# Patient Record
Sex: Female | Born: 1995 | Race: White | Hispanic: No | Marital: Married | Smoking: Never smoker
Health system: Southern US, Community
[De-identification: ages and names within clinical notes are randomized; demographics above are authoritative.]

## PROBLEM LIST (undated history)

## (undated) DIAGNOSIS — K219 Gastro-esophageal reflux disease without esophagitis: Secondary | ICD-10-CM

## (undated) HISTORY — PX: NEPHRECTOMY: SHX65

## (undated) HISTORY — DX: Gastro-esophageal reflux disease without esophagitis: K21.9

## (undated) HISTORY — PX: OTHER SURGICAL HISTORY: SHX169

## (undated) HISTORY — PX: CHOLECYSTECTOMY: SHX55

---
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
## 2018-01-04 ENCOUNTER — Encounter (HOSPITAL_COMMUNITY): Payer: Self-pay | Admitting: Emergency Medicine

## 2018-01-04 ENCOUNTER — Emergency Department (HOSPITAL_COMMUNITY)
Admission: EM | Admit: 2018-01-04 | Discharge: 2018-01-04 | Disposition: A | Discharge status: Home / Self Care | Payer: Self-pay | Attending: Emergency Medicine | Admitting: Emergency Medicine

## 2018-01-04 ENCOUNTER — Emergency Department (HOSPITAL_COMMUNITY): Payer: Self-pay

## 2018-01-04 DIAGNOSIS — R112 Nausea with vomiting, unspecified: Secondary | ICD-10-CM | POA: Insufficient documentation

## 2018-01-04 DIAGNOSIS — N390 Urinary tract infection, site not specified: Secondary | ICD-10-CM | POA: Insufficient documentation

## 2018-01-04 DIAGNOSIS — R42 Dizziness and giddiness: Secondary | ICD-10-CM | POA: Insufficient documentation

## 2018-01-04 LAB — CBC
HCT: 42.2 % (ref 36.0–46.0)
Hemoglobin: 14 g/dL (ref 12.0–15.0)
MCH: 27.9 pg (ref 26.0–34.0)
MCHC: 33.2 g/dL (ref 30.0–36.0)
MCV: 84.2 fL (ref 78.0–100.0)
Platelets: 304 10*3/uL (ref 150–400)
RBC: 5.01 MIL/uL (ref 3.87–5.11)
RDW: 13.4 % (ref 11.5–15.5)
WBC: 12.8 10*3/uL — ABNORMAL HIGH (ref 4.0–10.5)

## 2018-01-04 LAB — COMPREHENSIVE METABOLIC PANEL
ALT: 19 U/L (ref 14–54)
AST: 23 U/L (ref 15–41)
Albumin: 4.1 g/dL (ref 3.5–5.0)
Alkaline Phosphatase: 100 U/L (ref 38–126)
Anion gap: 8 (ref 5–15)
BUN: 15 mg/dL (ref 6–20)
CO2: 25 mmol/L (ref 22–32)
Calcium: 9.2 mg/dL (ref 8.9–10.3)
Chloride: 104 mmol/L (ref 101–111)
Creatinine, Ser: 0.74 mg/dL (ref 0.44–1.00)
GFR calc Af Amer: >60 mL/min (ref >60)
GFR calc non Af Amer: >60 mL/min (ref >60)
Glucose, Bld: 115 mg/dL — ABNORMAL HIGH (ref 65–99)
Potassium: 4.4 mmol/L (ref 3.5–5.1)
Sodium: 137 mmol/L (ref 135–145)
Total Bilirubin: 0.2 mg/dL — ABNORMAL LOW (ref 0.3–1.2)
Total Protein: 8 g/dL (ref 6.5–8.1)

## 2018-01-04 LAB — URINALYSIS, ROUTINE W REFLEX MICROSCOPIC
Bilirubin Urine: NEGATIVE
Glucose, UA: NEGATIVE mg/dL
Ketones, ur: NEGATIVE mg/dL
Nitrite: POSITIVE — AB
Protein, ur: 30 mg/dL — AB
Specific Gravity, Urine: 1.016 (ref 1.005–1.030)
pH: 6 (ref 5.0–8.0)

## 2018-01-04 LAB — I-STAT BETA HCG BLOOD, ED (MC, WL, AP ONLY): I-stat hCG, quantitative: <5 m[IU]/mL (ref <5)

## 2018-01-04 LAB — LIPASE, BLOOD: Lipase: 29 U/L (ref 11–51)

## 2018-01-04 MED ORDER — ONDANSETRON 4 MG PO TBDP
4.0000 mg | ORAL_TABLET | Freq: Once | ORAL | Status: AC | PRN
  Administered 2018-01-04: 4 mg via ORAL
  Filled 2018-01-04: qty 1

## 2018-01-04 MED ORDER — SODIUM CHLORIDE 0.9 % IV BOLUS (SEPSIS)
1000.0000 mL | Freq: Once | INTRAVENOUS | Status: AC
  Administered 2018-01-04: 1000 mL via INTRAVENOUS

## 2018-01-04 MED ORDER — PROMETHAZINE HCL 25 MG PO TABS: 25.0000 mg | ORAL_TABLET | Freq: Three times a day (TID) | ORAL | 0 refills | Status: DC | PRN

## 2018-01-04 MED ORDER — MECLIZINE HCL 25 MG PO TABS: 25.0000 mg | ORAL_TABLET | Freq: Three times a day (TID) | ORAL | 0 refills | Status: DC | PRN

## 2018-01-04 MED ORDER — PROMETHAZINE HCL 25 MG/ML IJ SOLN
25.0000 mg | Freq: Once | INTRAMUSCULAR | Status: AC
  Administered 2018-01-04: 25 mg via INTRAVENOUS
  Filled 2018-01-04: qty 1

## 2018-01-04 MED ORDER — GADOBENATE DIMEGLUMINE 529 MG/ML IV SOLN
17.0000 mL | Freq: Once | INTRAVENOUS | Status: AC | PRN
  Administered 2018-01-04: 20 mL via INTRAVENOUS

## 2018-01-04 MED ORDER — CEFTRIAXONE SODIUM 1 G IJ SOLR
1.0000 g | Freq: Once | INTRAMUSCULAR | Status: AC
  Administered 2018-01-04: 1 g via INTRAVENOUS
  Filled 2018-01-04: qty 10

## 2018-01-04 MED ORDER — SODIUM CHLORIDE 0.9 % IV BOLUS (SEPSIS)
500.0000 mL | Freq: Once | INTRAVENOUS | Status: AC
  Administered 2018-01-04: 500 mL via INTRAVENOUS

## 2018-01-04 MED ORDER — LORAZEPAM 2 MG/ML IJ SOLN
1.0000 mg | Freq: Once | INTRAMUSCULAR | Status: AC
  Administered 2018-01-04: 1 mg via INTRAVENOUS
  Filled 2018-01-04: qty 1

## 2018-01-04 MED ORDER — MECLIZINE HCL 25 MG PO TABS
50.0000 mg | ORAL_TABLET | Freq: Once | ORAL | Status: AC
  Administered 2018-01-04: 50 mg via ORAL
  Filled 2018-01-04: qty 2

## 2018-01-04 MED ORDER — CEPHALEXIN 500 MG PO CAPS: 500.0000 mg | ORAL_CAPSULE | Freq: Three times a day (TID) | ORAL | 0 refills | Status: DC

## 2018-01-04 MED ORDER — ONDANSETRON HCL 4 MG/2ML IJ SOLN
4.0000 mg | Freq: Once | INTRAMUSCULAR | Status: AC
  Administered 2018-01-04: 4 mg via INTRAVENOUS
  Filled 2018-01-04: qty 2

## 2018-01-04 NOTE — ED Notes (Signed)
Pt cannot use restroom at this time, aware urine specimen is needed.  

## 2018-01-04 NOTE — ED Provider Notes (Signed)
[facility]-EMERGENCY DEPT Provider Note   CSN: [id number] Arrival date & time: 01/04/18  4098     History   Chief Complaint Chief Complaint  Patient presents with  . Emesis    HPI Jacqueline Peck is a 22 y.o. female.  The history is provided by the patient. No language interpreter was used.  Emesis      Jacqueline Peck is a 22 y.o. female who presents to the Emergency Department complaining of vomiting, dizziness. She was in her routine state of health until 545 this morning when she awoke and had sudden onset dizziness described as a room spinning sensation as well as nausea and vomiting. The symptoms are constant but do worsen and improve at times. She is having difficulty walking and needs assistance to walk due to ataxia. She has associated mild headache. No vision changes, numbness. She has a generalized weakness. No recent injuries or illnesses. No prior similar symptoms. She does not smoke. She drinks occasional alcohol. No street drugs. No family history of significant medical issues.  History reviewed. No pertinent past medical history.  There are no active problems to display for this patient.   Past Surgical History:  Procedure Laterality Date  . kidney removed      OB History    No data available       Home Medications    Prior to Admission medications   Medication Sig Start Date End Date Taking? Authorizing Provider  ibuprofen (ADVIL,MOTRIN) 200 MG tablet Take 200 mg by mouth every 6 (six) hours as needed for headache.   Yes [provider]  cephALEXin (KEFLEX) 500 MG capsule Take 1 capsule (500 mg total) by mouth 3 (three) times daily. 01/04/18   Tilden Fossa, MD  meclizine (ANTIVERT) 25 MG tablet Take 1 tablet (25 mg total) by mouth 3 (three) times daily as needed for dizziness. 01/04/18   Tilden Fossa, MD  permethrin (ELIMITE) 5 % cream Use as directed on bottle and repeat in 1 week. Patient not taking: Reported on  01/04/2018 09/25/12   Linna Hoff, MD  promethazine (PHENERGAN) 25 MG tablet Take 1 tablet (25 mg total) by mouth every 8 (eight) hours as needed for nausea or vomiting. 01/04/18   Tilden Fossa, MD    Family History No family history on file.  Social History Social History   Tobacco Use  . Smoking status: Not on file  Substance Use Topics  . Alcohol use: Not on file  . Drug use: Not on file     Allergies   Patient has no known allergies.   Review of Systems Review of Systems  Gastrointestinal: Positive for vomiting.  All other systems reviewed and are negative.    Physical Exam Updated Vital Signs BP 131/89   Pulse 82   Temp 97.7 F (36.5 C) (Oral)   Resp 18   Ht 5\' 4"  (1.626 m)   Wt 83.9 kg (185 lb)   SpO2 100%   BMI 31.76 kg/m   Physical Exam  Constitutional: She is oriented to person, place, and time. She appears well-developed and well-nourished.  Uncomfortable appearing  HENT:  Head: Normocephalic and atraumatic.  Eyes: Pupils are equal, round, and reactive to light.  Pupils equal round and reactive. EOM I. There is horizontal nystagmus with fast feet to the left.  Cardiovascular: Normal rate and regular rhythm.  No murmur heard. Pulmonary/Chest: Effort normal and breath sounds normal. No respiratory distress.  Abdominal: Soft. There is  no tenderness. There is no rebound and no guarding.  Musculoskeletal: She exhibits no edema or tenderness.  Neurological: She is alert and oriented to person, place, and time.  No facial asymmetry. Five out of five strength in all four extremities with sensation to light touch intact in all four extremities. No pronator drift. Ataxic gait.   Skin: Skin is warm and dry.  Psychiatric: She has a normal mood and affect. Her behavior is normal.  Nursing note and vitals reviewed.    ED Treatments / Results  Labs (all labs ordered are listed, but only abnormal results are displayed) Labs Reviewed  COMPREHENSIVE  METABOLIC PANEL - Abnormal; Notable for the following components:      Result Value   Glucose, Bld 115 (*)    Total Bilirubin 0.2 (*)    All other components within normal limits  CBC - Abnormal; Notable for the following components:   WBC 12.8 (*)    All other components within normal limits  URINALYSIS, ROUTINE W REFLEX MICROSCOPIC - Abnormal; Notable for the following components:   APPearance HAZY (*)    Hgb urine dipstick SMALL (*)    Protein, ur 30 (*)    Nitrite POSITIVE (*)    Leukocytes, UA SMALL (*)    Bacteria, UA MANY (*)    Squamous Epithelial / LPF 0-5 (*)    All other components within normal limits  LIPASE, BLOOD  I-STAT BETA HCG BLOOD, ED ([facility], [facility], [facility] ONLY)    EKG  EKG Interpretation None       Radiology Mr Laqueta JeanBrain W Wo Contrast  Result Date: 01/04/2018 CLINICAL DATA:  22 y/o  F; dizziness today. EXAM: MRI HEAD WITHOUT AND WITH CONTRAST TECHNIQUE: Multiplanar, multiecho pulse sequences of the brain and surrounding structures were obtained without and with intravenous contrast. CONTRAST:  17 cc MULTIHANCE GADOBENATE DIMEGLUMINE 529 MG/ML IV SOLN COMPARISON:  None. FINDINGS: Brain: No acute infarction, hemorrhage, hydrocephalus, extra-axial collection or mass lesion. No abnormal enhancement after administration of intravenous contrast. Vascular: Normal flow voids. Skull and upper cervical spine: Normal marrow signal. Sinuses/Orbits: Opacification of right frontal sinus, right anterior ethmoid air cells, and right maxillary sinus. Mild left maxillary sinus mucosal thickening. No significant abnormal signal of mastoid air cells. Orbits are unremarkable. Other: None. IMPRESSION: 1. Normal MRI of the brain. 2. Right frontal, anterior ethmoid, and maxillary sinus opacification is a right middle meatus obstructive pattern, direct visualization recommended. Electronically Signed   By: Mitzi HansenLance  Furusawa-Stratton M.D.   On: 01/04/2018 20:53    Procedures Procedures (including  critical care time)  Medications Ordered in ED Medications  ondansetron (ZOFRAN-ODT) disintegrating tablet 4 mg (4 mg Oral Given 01/04/18 1018)  sodium chloride 0.9 % bolus 1,000 mL (0 mLs Intravenous Stopped 01/04/18 1935)  ondansetron (ZOFRAN) injection 4 mg (4 mg Intravenous Given 01/04/18 1829)  LORazepam (ATIVAN) injection 1 mg (1 mg Intravenous Given 01/04/18 1828)  gadobenate dimeglumine (MULTIHANCE) injection 17 mL (20 mLs Intravenous Contrast Given 01/04/18 2023)  sodium chloride 0.9 % bolus 500 mL (0 mLs Intravenous Stopped 01/04/18 2139)  cefTRIAXone (ROCEPHIN) 1 g in sodium chloride 0.9 % 100 mL IVPB (0 g Intravenous Stopped 01/04/18 2130)  promethazine (PHENERGAN) injection 25 mg (25 mg Intravenous Given 01/04/18 2135)  meclizine (ANTIVERT) tablet 50 mg (50 mg Oral Given 01/04/18 2135)     Initial Impression / Assessment and Plan / ED Course  I have reviewed the triage vital signs and the nursing notes.  Pertinent labs & imaging  results that were available during my care of the patient were reviewed by me and considered in my medical decision making (see chart for details).     Patient here for evaluation of dizziness and vomiting. She does have nice documents on examination at no very toxic gait secondary to vertigo. Otherwise she is neurologically intact. UA is consistent with UTI, will treat with antibiotics given her symptoms. MRI obtained given severe symptoms with no significant acute abnormalities. Presentation and examination is not consistent with sinusitis or otitis media. Following treatment with antiemetics and meclizine in the department she is feeling improved and able to ambulate on her own. Plan to discharge home with treatment for UTI as well as vertigo. Discussed home care, outpatient follow-up and return precautions.  Final Clinical Impressions(s) / ED Diagnoses   Final diagnoses:  Vertigo  Non-intractable vomiting with nausea, unspecified vomiting type  Acute UTI     ED Discharge Orders        Ordered    meclizine (ANTIVERT) 25 MG tablet  3 times daily PRN     01/04/18 2247    cephALEXin (KEFLEX) 500 MG capsule  3 times daily     01/04/18 2247    promethazine (PHENERGAN) 25 MG tablet  Every 8 hours PRN     01/04/18 2247       Tilden Fossa, MD 01/04/18 2350

## 2018-01-04 NOTE — ED Triage Notes (Signed)
Pt c/o dizziness and n/v since waking up this am around 630a. Denies diarrhea or urinary problems.

## 2018-10-23 NOTE — L&D Delivery Note (Signed)
Pt had SROM early this am. She progressed very slowly to C/C/+1 station. She had an amnioinfusion for 4 hours prior to delivery. She pushed for 2 hours. She developed exhaustion and was offered the VE.This was placed at +3 station. She delivered with one contraction, no pop offs. NICU team in room for mec. Art cord gas 7.19. Placenta S/I. EBL-400cc. Second degree midline tear closed with 3-0 chromic. Baby to NBN.

## 2019-03-04 LAB — OB RESULTS CONSOLE RPR: RPR: NONREACTIVE

## 2019-03-04 LAB — OB RESULTS CONSOLE GC/CHLAMYDIA
Chlamydia: NEGATIVE
Gonorrhea: NEGATIVE

## 2019-03-04 LAB — OB RESULTS CONSOLE ANTIBODY SCREEN: Antibody Screen: NEGATIVE

## 2019-03-04 LAB — OB RESULTS CONSOLE ABO/RH: RH Type: POSITIVE

## 2019-03-04 LAB — OB RESULTS CONSOLE RUBELLA ANTIBODY, IGM: Rubella: NON-IMMUNE/NOT IMMUNE

## 2019-03-04 LAB — OB RESULTS CONSOLE HEPATITIS B SURFACE ANTIGEN: Hepatitis B Surface Ag: NEGATIVE

## 2019-03-04 LAB — OB RESULTS CONSOLE HIV ANTIBODY (ROUTINE TESTING): HIV: NONREACTIVE

## 2019-04-22 DIAGNOSIS — Z363 Encounter for antenatal screening for malformations: Secondary | ICD-10-CM | POA: Diagnosis not present

## 2019-06-10 DIAGNOSIS — Z3480 Encounter for supervision of other normal pregnancy, unspecified trimester: Secondary | ICD-10-CM | POA: Diagnosis not present

## 2019-07-03 DIAGNOSIS — Z23 Encounter for immunization: Secondary | ICD-10-CM | POA: Diagnosis not present

## 2019-07-16 ENCOUNTER — Other Ambulatory Visit: Payer: Self-pay

## 2019-07-16 ENCOUNTER — Inpatient Hospital Stay (HOSPITAL_COMMUNITY)
Admission: AD | Admit: 2019-07-16 | Discharge: 2019-07-16 | Disposition: A | Discharge status: Home / Self Care | Payer: Medicaid Other | Attending: Obstetrics | Admitting: Obstetrics

## 2019-07-16 ENCOUNTER — Encounter (HOSPITAL_COMMUNITY): Payer: Self-pay

## 2019-07-16 DIAGNOSIS — N859 Noninflammatory disorder of uterus, unspecified: Secondary | ICD-10-CM | POA: Insufficient documentation

## 2019-07-16 DIAGNOSIS — Z905 Acquired absence of kidney: Secondary | ICD-10-CM | POA: Diagnosis not present

## 2019-07-16 DIAGNOSIS — O9989 Other specified diseases and conditions complicating pregnancy, childbirth and the puerperium: Secondary | ICD-10-CM | POA: Insufficient documentation

## 2019-07-16 DIAGNOSIS — O26893 Other specified pregnancy related conditions, third trimester: Secondary | ICD-10-CM

## 2019-07-16 DIAGNOSIS — Z3A31 31 weeks gestation of pregnancy: Secondary | ICD-10-CM | POA: Insufficient documentation

## 2019-07-16 DIAGNOSIS — N858 Other specified noninflammatory disorders of uterus: Secondary | ICD-10-CM

## 2019-07-16 DIAGNOSIS — R1012 Left upper quadrant pain: Secondary | ICD-10-CM

## 2019-07-16 LAB — URINALYSIS, ROUTINE W REFLEX MICROSCOPIC
Bilirubin Urine: NEGATIVE
Glucose, UA: NEGATIVE mg/dL
Hgb urine dipstick: NEGATIVE
Ketones, ur: NEGATIVE mg/dL
Leukocytes,Ua: NEGATIVE
Nitrite: NEGATIVE
Protein, ur: NEGATIVE mg/dL
Specific Gravity, Urine: 1.011 (ref 1.005–1.030)
pH: 6 (ref 5.0–8.0)

## 2019-07-16 LAB — FETAL FIBRONECTIN: Fetal Fibronectin: NEGATIVE

## 2019-07-16 MED ORDER — NIFEDIPINE 10 MG PO CAPS
10.0000 mg | ORAL_CAPSULE | ORAL | Status: DC | PRN
  Administered 2019-07-16 (×2): 10 mg via ORAL
  Filled 2019-07-16 (×2): qty 1

## 2019-07-16 NOTE — MAU Note (Deleted)
Pt complaining of abdominal pain 1100 around every 20-30 minutes at 7/10. No bleeding or leaking. +FM.

## 2019-07-16 NOTE — MAU Note (Addendum)
Reports having a sharp upper left sided abdominal pain that comes and goes-first started 3 hours ago and occurs approximately every 20-30 mins.  No LOF/VB.  States she hasn't ever felt this pain before.  Endorses + FM.  Did not take anything for the pain.  Also feeling some lower abdominal cramps that feel like occasional period cramps.

## 2019-07-16 NOTE — Discharge Instructions (Signed)
Preterm Labor and Birth Information °Pregnancy normally lasts 39-41 weeks. Preterm labor is when labor starts early. It starts before you have been pregnant for 37 whole weeks. °What are the risk factors for preterm labor? °Preterm labor is more likely to occur in women who: °· Have an infection while pregnant. °· Have a cervix that is short. °· Have gone into preterm labor before. °· Have had surgery on their cervix. °· Are younger than age 24. °· Are older than age 35. °· Are African American. °· Are pregnant with two or more babies. °· Take street drugs while pregnant. °· Smoke while pregnant. °· Do not gain enough weight while pregnant. °· Got pregnant right after another pregnancy. °What are the symptoms of preterm labor? °Symptoms of preterm labor include: °· Cramps. The cramps may feel like the cramps some women get during their period. The cramps may happen with watery poop (diarrhea). °· Pain in the belly (abdomen). °· Pain in the lower back. °· Regular contractions or tightening. It may feel like your belly is getting tighter. °· Pressure in the lower belly that seems to get stronger. °· More fluid (discharge) leaking from the vagina. The fluid may be watery or bloody. °· Water breaking. °Why is it important to notice signs of preterm labor? °Babies who are born early may not be fully developed. They have a higher chance for: °· Long-term heart problems. °· Long-term lung problems. °· Trouble controlling body systems, like breathing. °· Bleeding in the brain. °· A condition called cerebral palsy. °· Learning difficulties. °· Death. °These risks are highest for babies who are born before 34 weeks of pregnancy. °How is preterm labor treated? °Treatment depends on: °· How long you were pregnant. °· Your condition. °· The health of your baby. °Treatment may involve: °· Having a stitch (suture) placed in your cervix. When you give birth, your cervix opens so the baby can come out. The stitch keeps the cervix  from opening too soon. °· Staying at the hospital. °· Taking or getting medicines, such as: °? Hormone medicines. °? Medicines to stop contractions. °? Medicines to help the baby’s lungs develop. °? Medicines to prevent your baby from having cerebral palsy. °What should I do if I am in preterm labor? °If you think you are going into labor too soon, call your doctor right away. °How can I prevent preterm labor? °· Do not use any tobacco products. °? Examples of these are cigarettes, chewing tobacco, and e-cigarettes. °? If you need help quitting, ask your doctor. °· Do not use street drugs. °· Do not use any medicines unless you ask your doctor if they are safe for you. °· Talk with your doctor before taking any herbal supplements. °· Make sure you gain enough weight. °· Watch for infection. If you think you might have an infection, get it checked right away. °· If you have gone into preterm labor before, tell your doctor. °This information is not intended to replace advice given to you by your health care provider. Make sure you discuss any questions you have with your health care provider. °Document Released: 01/05/2009 Document Revised: 01/31/2019 Document Reviewed: 03/01/2016 °Elsevier Patient Education © 2020 [employer] ° °

## 2019-07-16 NOTE — MAU Provider Note (Signed)
Chief Complaint:  Abdominal Pain   First Provider Initiated Contact with Patient 07/16/19 0305      HPI: Jacqueline Peck is a 23 y.o. G1P0 at 85w3dwho presents to maternity admissions reporting colicky sharp pain in her Left upper abdomen.  Comes and goes.  Pretty sharp and severe when it came.  Also has some lower pelvic cramping.  . She reports good fetal movement, denies LOF, vaginal bleeding, vaginal itching/burning, urinary symptoms, h/a, dizziness, n/v, diarrhea, constipation or fever/chills.    RN Note: Reports having a sharp upper left sided abdominal pain that comes and goes-first started 3 hours ago and occurs approximately every 20-30 mins.  No LOF/VB.  States she hasn't ever felt this pain before.  Endorses + FM.  Did not take anything for the pain.  Also feeling some lower abdominal cramps that feel like occasional period cramps.  Past Medical History: History reviewed. No pertinent past medical history.  Past obstetric history: OB History  Gravida Para Term Preterm AB Living  1            SAB TAB Ectopic Multiple Live Births               # Outcome Date GA Lbr Len/2nd Weight Sex Delivery Anes PTL Lv  1 Current             Past Surgical History: Past Surgical History:  Procedure Laterality Date  . NEPHRECTOMY Right     Family History: No family history on file.  Social History: Social History   Tobacco Use  . Smoking status: Never Smoker  . Smokeless tobacco: Never Used  Substance Use Topics  . Alcohol use: Not Currently  . Drug use: Never    Allergies: No Known Allergies  Meds:  No medications prior to admission.    I have reviewed patient's Past Medical Hx, Surgical Hx, Family Hx, Social Hx, medications and allergies.   ROS:  Review of Systems  Constitutional: Negative for chills and fever.  Respiratory: Negative for shortness of breath.   Gastrointestinal: Positive for abdominal pain. Negative for constipation, diarrhea and nausea.  Genitourinary:  Positive for pelvic pain. Negative for vaginal bleeding and vaginal discharge.  Musculoskeletal: Positive for back pain.   Other systems negative  Physical Exam   Patient Vitals for the past 24 hrs:  BP Temp Temp src Pulse Resp Weight  07/16/19 0248 133/78 98.5 F (36.9 C) Oral 86 19 -  07/16/19 0243 - - - - - 92.7 kg   Constitutional: Well-developed, well-nourished female in no acute distress.  Cardiovascular: normal rate and rhythm Respiratory: normal effort, clear to auscultation bilaterally GI: Abd soft, non-tender, gravid appropriate for gestational age.   No rebound or guarding. MS: Extremities nontender, no edema, normal ROM Neurologic: Alert and oriented x 4.  GU: Neg CVAT.  PELVIC EXAM:  Dilation: Closed Effacement (%): Thick Station: Ballotable Exam by:: Lelan Pons, CNM   FHT:  Baseline 140 , moderate variability, accelerations present, no decelerations Contractions: Uterine irritability   Labs: Results for orders placed or performed during the hospital encounter of 07/16/19 (from the past 24 hour(s))  Urinalysis, Routine w reflex microscopic     Status: None   Collection Time: 07/16/19  2:50 AM  Result Value Ref Range   Color, Urine YELLOW YELLOW   APPearance CLEAR CLEAR   Specific Gravity, Urine 1.011 1.005 - 1.030   pH 6.0 5.0 - 8.0   Glucose, UA NEGATIVE NEGATIVE mg/dL   Hgb urine dipstick NEGATIVE  NEGATIVE   Bilirubin Urine NEGATIVE NEGATIVE   Ketones, ur NEGATIVE NEGATIVE mg/dL   Protein, ur NEGATIVE NEGATIVE mg/dL   Nitrite NEGATIVE NEGATIVE   Leukocytes,Ua NEGATIVE NEGATIVE  Fetal fibronectin     Status: None   Collection Time: 07/16/19  3:16 AM  Result Value Ref Range   Fetal Fibronectin NEGATIVE NEGATIVE      Imaging:  No results found.  MAU Course/MDM: I have ordered labs and reviewed results. Urine is clear. Fetal fibronectin is negative NST reviewed, reassuring Uterine irritability noted with a few contractions Procardia given x 2 doses  with good relief of irritabilty while waiting for FFn to result  Assessment: Single intrauterine pregnancy at [redacted]w[redacted]d Uterine irritability LUQ pain, colicky, likely related to bowel gas  Plan: Discharge home Preterm Labor precautions and fetal kick counts Follow up in Office for prenatal visits and recheck of status  Encouraged to return here or to other Urgent Care/ED if she develops worsening of symptoms, increase in pain, fever, or other concerning symptoms.   Pt stable at time of discharge.  Wynelle Bourgeois CNM, MSN Certified Nurse-Midwife 07/16/2019 3:05 AM

## 2019-08-21 DIAGNOSIS — Z3480 Encounter for supervision of other normal pregnancy, unspecified trimester: Secondary | ICD-10-CM | POA: Diagnosis not present

## 2019-08-21 LAB — OB RESULTS CONSOLE GBS: GBS: POSITIVE

## 2019-09-11 DIAGNOSIS — O26843 Uterine size-date discrepancy, third trimester: Secondary | ICD-10-CM | POA: Diagnosis not present

## 2019-09-11 DIAGNOSIS — O403XX0 Polyhydramnios, third trimester, not applicable or unspecified: Secondary | ICD-10-CM | POA: Diagnosis not present

## 2019-09-12 ENCOUNTER — Telehealth (HOSPITAL_COMMUNITY): Payer: Self-pay | Admitting: *Deleted

## 2019-09-12 ENCOUNTER — Other Ambulatory Visit: Payer: Self-pay | Admitting: Obstetrics and Gynecology

## 2019-09-12 NOTE — Telephone Encounter (Signed)
Preadmission screen  

## 2019-09-13 ENCOUNTER — Other Ambulatory Visit (HOSPITAL_COMMUNITY)
Admission: RE | Admit: 2019-09-13 | Discharge: 2019-09-13 | Disposition: A | Discharge status: Home / Self Care | Payer: Medicaid Other | Source: Ambulatory Visit | Attending: Obstetrics and Gynecology | Admitting: Obstetrics and Gynecology

## 2019-09-13 ENCOUNTER — Other Ambulatory Visit (HOSPITAL_COMMUNITY): Payer: Medicaid Other

## 2019-09-13 DIAGNOSIS — Z01812 Encounter for preprocedural laboratory examination: Secondary | ICD-10-CM | POA: Insufficient documentation

## 2019-09-13 DIAGNOSIS — Z20828 Contact with and (suspected) exposure to other viral communicable diseases: Secondary | ICD-10-CM | POA: Insufficient documentation

## 2019-09-13 LAB — SARS CORONAVIRUS 2 (TAT 6-24 HRS): SARS Coronavirus 2: NEGATIVE

## 2019-09-15 ENCOUNTER — Inpatient Hospital Stay (HOSPITAL_COMMUNITY): Payer: Medicaid Other

## 2019-09-15 ENCOUNTER — Encounter (HOSPITAL_COMMUNITY): Payer: Self-pay | Admitting: General Practice

## 2019-09-15 ENCOUNTER — Other Ambulatory Visit: Payer: Self-pay

## 2019-09-15 ENCOUNTER — Inpatient Hospital Stay (HOSPITAL_COMMUNITY)
Admission: AD | Admit: 2019-09-15 | Discharge: 2019-09-18 | DRG: 807 | Disposition: A | Discharge status: Home / Self Care | Payer: Medicaid Other | Attending: Obstetrics and Gynecology | Admitting: Obstetrics and Gynecology

## 2019-09-15 DIAGNOSIS — O1414 Severe pre-eclampsia complicating childbirth: Secondary | ICD-10-CM | POA: Diagnosis present

## 2019-09-15 DIAGNOSIS — Z23 Encounter for immunization: Secondary | ICD-10-CM | POA: Diagnosis not present

## 2019-09-15 DIAGNOSIS — Z3490 Encounter for supervision of normal pregnancy, unspecified, unspecified trimester: Secondary | ICD-10-CM

## 2019-09-15 DIAGNOSIS — Z3A40 40 weeks gestation of pregnancy: Secondary | ICD-10-CM | POA: Diagnosis not present

## 2019-09-15 DIAGNOSIS — O164 Unspecified maternal hypertension, complicating childbirth: Secondary | ICD-10-CM | POA: Diagnosis not present

## 2019-09-15 DIAGNOSIS — O99824 Streptococcus B carrier state complicating childbirth: Secondary | ICD-10-CM | POA: Diagnosis not present

## 2019-09-15 DIAGNOSIS — O409XX0 Polyhydramnios, unspecified trimester, not applicable or unspecified: Secondary | ICD-10-CM | POA: Diagnosis present

## 2019-09-15 DIAGNOSIS — O403XX0 Polyhydramnios, third trimester, not applicable or unspecified: Principal | ICD-10-CM | POA: Diagnosis present

## 2019-09-15 DIAGNOSIS — O401XX0 Polyhydramnios, first trimester, not applicable or unspecified: Secondary | ICD-10-CM

## 2019-09-15 LAB — COMPREHENSIVE METABOLIC PANEL
ALT: 11 U/L (ref 0–44)
AST: 16 U/L (ref 15–41)
Albumin: 2.3 g/dL — ABNORMAL LOW (ref 3.5–5.0)
Alkaline Phosphatase: 177 U/L — ABNORMAL HIGH (ref 38–126)
Anion gap: 11 (ref 5–15)
BUN: 8 mg/dL (ref 6–20)
CO2: 21 mmol/L — ABNORMAL LOW (ref 22–32)
Calcium: 9 mg/dL (ref 8.9–10.3)
Chloride: 104 mmol/L (ref 98–111)
Creatinine, Ser: 0.83 mg/dL (ref 0.44–1.00)
GFR calc Af Amer: >60 mL/min (ref >60)
GFR calc non Af Amer: >60 mL/min (ref >60)
Glucose, Bld: 85 mg/dL (ref 70–99)
Potassium: 4.1 mmol/L (ref 3.5–5.1)
Sodium: 136 mmol/L (ref 135–145)
Total Bilirubin: 1.1 mg/dL (ref 0.3–1.2)
Total Protein: 6.1 g/dL — ABNORMAL LOW (ref 6.5–8.1)

## 2019-09-15 LAB — CBC
HCT: 33.1 % — ABNORMAL LOW (ref 36.0–46.0)
HCT: 31 % — ABNORMAL LOW (ref 36.0–46.0)
Hemoglobin: 9.9 g/dL — ABNORMAL LOW (ref 12.0–15.0)
Hemoglobin: 9.9 g/dL — ABNORMAL LOW (ref 12.0–15.0)
MCH: 25.6 pg — ABNORMAL LOW (ref 26.0–34.0)
MCH: 25.8 pg — ABNORMAL LOW (ref 26.0–34.0)
MCHC: 29.9 g/dL — ABNORMAL LOW (ref 30.0–36.0)
MCHC: 31.9 g/dL (ref 30.0–36.0)
MCV: 85.8 fL (ref 80.0–100.0)
MCV: 80.9 fL (ref 80.0–100.0)
Platelets: 258 10*3/uL (ref 150–400)
Platelets: 250 10*3/uL (ref 150–400)
RBC: 3.86 MIL/uL — ABNORMAL LOW (ref 3.87–5.11)
RBC: 3.83 MIL/uL — ABNORMAL LOW (ref 3.87–5.11)
RDW: 14.7 % (ref 11.5–15.5)
RDW: 14.8 % (ref 11.5–15.5)
WBC: 10.6 10*3/uL — ABNORMAL HIGH (ref 4.0–10.5)
WBC: 13 10*3/uL — ABNORMAL HIGH (ref 4.0–10.5)
nRBC: 0 % (ref 0.0–0.2)
nRBC: 0 % (ref 0.0–0.2)

## 2019-09-15 LAB — ABO/RH: ABO/RH(D): O POS

## 2019-09-15 LAB — PROTEIN / CREATININE RATIO, URINE
Creatinine, Urine: 60.59 mg/dL
Protein Creatinine Ratio: 0.84 mg/mg{Cre} — ABNORMAL HIGH (ref 0.00–0.15)
Total Protein, Urine: 51 mg/dL

## 2019-09-15 LAB — TYPE AND SCREEN
ABO/RH(D): O POS
Antibody Screen: NEGATIVE

## 2019-09-15 LAB — RPR: RPR Ser Ql: NONREACTIVE

## 2019-09-15 MED ORDER — ONDANSETRON HCL 4 MG/2ML IJ SOLN
4.0000 mg | Freq: Four times a day (QID) | INTRAMUSCULAR | Status: DC | PRN
  Administered 2019-09-16: 4 mg via INTRAVENOUS
  Filled 2019-09-15: qty 2

## 2019-09-15 MED ORDER — DIPHENHYDRAMINE HCL 50 MG/ML IJ SOLN: 12.5000 mg | INTRAMUSCULAR | Status: DC | PRN

## 2019-09-15 MED ORDER — LABETALOL HCL 5 MG/ML IV SOLN: 80.0000 mg | INTRAVENOUS | Status: DC | PRN

## 2019-09-15 MED ORDER — LACTATED RINGERS IV SOLN
500.0000 mL | Freq: Once | INTRAVENOUS | Status: AC
  Administered 2019-09-16: 500 mL via INTRAVENOUS

## 2019-09-15 MED ORDER — HYDRALAZINE HCL 20 MG/ML IJ SOLN: 10.0000 mg | INTRAMUSCULAR | Status: DC | PRN

## 2019-09-15 MED ORDER — LACTATED RINGERS IV SOLN
INTRAVENOUS | Status: DC
  Administered 2019-09-15 – 2019-09-16 (×5): via INTRAVENOUS

## 2019-09-15 MED ORDER — LABETALOL HCL 5 MG/ML IV SOLN: 40.0000 mg | INTRAVENOUS | Status: DC | PRN

## 2019-09-15 MED ORDER — EPHEDRINE 5 MG/ML INJ: 10.0000 mg | INTRAVENOUS | Status: DC | PRN

## 2019-09-15 MED ORDER — PHENYLEPHRINE 40 MCG/ML (10ML) SYRINGE FOR IV PUSH (FOR BLOOD PRESSURE SUPPORT): 80.0000 ug | PREFILLED_SYRINGE | INTRAVENOUS | Status: DC | PRN

## 2019-09-15 MED ORDER — OXYTOCIN 40 UNITS IN NORMAL SALINE INFUSION - SIMPLE MED
1.0000 m[IU]/min | INTRAVENOUS | Status: DC
  Filled 2019-09-15: qty 1000

## 2019-09-15 MED ORDER — SOD CITRATE-CITRIC ACID 500-334 MG/5ML PO SOLN
30.0000 mL | ORAL | Status: DC | PRN
  Administered 2019-09-16 (×2): 30 mL via ORAL
  Filled 2019-09-15 (×2): qty 30

## 2019-09-15 MED ORDER — OXYTOCIN 40 UNITS IN NORMAL SALINE INFUSION - SIMPLE MED
1.0000 m[IU]/min | INTRAVENOUS | Status: DC
  Administered 2019-09-15: 1 m[IU]/min via INTRAVENOUS

## 2019-09-15 MED ORDER — PENICILLIN G POT IN DEXTROSE 60000 UNIT/ML IV SOLN
3.0000 10*6.[IU] | INTRAVENOUS | Status: DC
  Administered 2019-09-15 – 2019-09-16 (×7): 3 10*6.[IU] via INTRAVENOUS
  Filled 2019-09-15 (×7): qty 50

## 2019-09-15 MED ORDER — ACETAMINOPHEN 325 MG PO TABS: 650.0000 mg | ORAL_TABLET | ORAL | Status: DC | PRN

## 2019-09-15 MED ORDER — LIDOCAINE HCL (PF) 1 % IJ SOLN
30.0000 mL | INTRAMUSCULAR | Status: AC | PRN
  Administered 2019-09-16: 30 mL via SUBCUTANEOUS
  Filled 2019-09-15 (×2): qty 30

## 2019-09-15 MED ORDER — SODIUM CHLORIDE 0.9 % IV SOLN
5.0000 10*6.[IU] | Freq: Once | INTRAVENOUS | Status: AC
  Administered 2019-09-15: 5 10*6.[IU] via INTRAVENOUS
  Filled 2019-09-15: qty 5

## 2019-09-15 MED ORDER — OXYTOCIN 40 UNITS IN NORMAL SALINE INFUSION - SIMPLE MED: 2.5000 [IU]/h | INTRAVENOUS | Status: DC

## 2019-09-15 MED ORDER — LACTATED RINGERS IV SOLN
500.0000 mL | INTRAVENOUS | Status: DC | PRN
  Administered 2019-09-16 (×2): 500 mL via INTRAVENOUS

## 2019-09-15 MED ORDER — TERBUTALINE SULFATE 1 MG/ML IJ SOLN: 0.2500 mg | Freq: Once | INTRAMUSCULAR | Status: DC | PRN

## 2019-09-15 MED ORDER — FENTANYL-BUPIVACAINE-NACL 0.5-0.125-0.9 MG/250ML-% EP SOLN
12.0000 mL/h | EPIDURAL | Status: DC | PRN
  Filled 2019-09-15: qty 250

## 2019-09-15 MED ORDER — ZOLPIDEM TARTRATE 5 MG PO TABS
5.0000 mg | ORAL_TABLET | Freq: Every evening | ORAL | Status: DC | PRN
  Administered 2019-09-15: 5 mg via ORAL
  Filled 2019-09-15: qty 1

## 2019-09-15 MED ORDER — OXYTOCIN BOLUS FROM INFUSION
500.0000 mL | Freq: Once | INTRAVENOUS | Status: AC
  Administered 2019-09-16: 500 mL via INTRAVENOUS

## 2019-09-15 MED ORDER — LABETALOL HCL 5 MG/ML IV SOLN: 20.0000 mg | INTRAVENOUS | Status: DC | PRN

## 2019-09-15 MED ORDER — OXYTOCIN 40 UNITS IN NORMAL SALINE INFUSION - SIMPLE MED: 1.0000 m[IU]/min | INTRAVENOUS | Status: DC

## 2019-09-15 NOTE — H&P (Signed)
23 y.o. [redacted]w[redacted]d  G1P0 comes in for scheduled IOL for term polyhydramios.  Pt had [place] 11/19 indicating AFI 34 and EFW 8#1. Otherwise has good fetal movement and no bleeding.  History reviewed. No pertinent past medical history.  Past Surgical History:  Procedure Laterality Date  . NEPHRECTOMY Right     OB History  Gravida Para Term Preterm AB Living  1            SAB TAB Ectopic Multiple Live Births               # Outcome Date GA Lbr Len/2nd Weight Sex Delivery Anes PTL Lv  1 Current             Social History   Socioeconomic History  . Marital status: Married    Spouse name: Not on file  . Number of children: Not on file  . Years of education: Not on file  . Highest education level: Not on file  Occupational History  . Not on file  Social Needs  . Financial resource strain: Not on file  . Food insecurity    Worry: Not on file    Inability: Not on file  . Transportation needs    Medical: Not on file    Non-medical: Not on file  Tobacco Use  . Smoking status: Never Smoker  . Smokeless tobacco: Never Used  Substance and Sexual Activity  . Alcohol use: Not Currently  . Drug use: Never  . Sexual activity: Yes  Lifestyle  . Physical activity    Days per week: Not on file    Minutes per session: Not on file  . Stress: Not on file  Relationships  . Social Herbalist on phone: Not on file    Gets together: Not on file    Attends religious service: Not on file    Active member of club or organization: Not on file    Attends meetings of clubs or organizations: Not on file    Relationship status: Not on file  . Intimate partner violence    Fear of current or ex partner: Not on file    Emotionally abused: Not on file    Physically abused: Not on file    Forced sexual activity: Not on file  Other Topics Concern  . Not on file  Social History Narrative  . Not on file   Patient has no known allergies.    Prenatal Transfer Tool  Maternal Diabetes: No Genetic  Screening: Normal Maternal Ultrasounds/Referrals: Normal Fetal Ultrasounds or other Referrals:  None Maternal Substance Abuse:  No Significant Maternal Medications:  None Significant Maternal Lab Results: Group B Strep positive  Other PNC: uncomplicated.    Vitals:   09/15/19 1130 09/15/19 1200 09/15/19 1230 09/15/19 1300  BP: (!) 144/91 134/87 133/78 137/86  Pulse: 83 85 78 (!) 113  Resp: 18 16 16 18   Temp:  98.2 F (36.8 C)    TempSrc:  Oral    Weight:      Height:        Lungs/Cor:  NAD Abdomen:  soft, gravid Ex:  no cords, erythema SVE:  2/50/-3 FHTs:  125, good STV, NST R; Cat 1 tracing. Toco:  q2-3   A/P   Admit for term IOL with polyhydramnios  GBS Pos- PCN Other routine care  Allyn Kenner

## 2019-09-15 NOTE — Progress Notes (Signed)
Requested by RN to confirm fetal position per private physician. Patient informed that the ultrasound is considered a limited OB ultrasound and is not intended to be a complete ultrasound exam. Patient also informed that the ultrasound is not being completed with the intent of assessing for fetal or placental anomalies or any pelvic abnormalities. Explained that the purpose of today's ultrasound is to assess for fetal position. Vertex by BSUS.  Barrington Ellison, MD [facility] Family Medicine Fellow, [facility] for [employer], [place]

## 2019-09-15 NOTE — Progress Notes (Signed)
Patient feeling mild to moderate contractions.  No other complaints. Recently had severe range BP while on birthing ball, follow up BP improved, but 3rd again severe range.  Pt brought back to bed and mild range again. FHT Cat 1 TOCO: being readjusted, had been 1.5-64min SVE: 4/60/B A/P: term IOL with poly - attempted AROM but head currently ballotable and drifting with attempt, advised pt to call RN if spontaneous rupture to ensure baby remains on monitor -will continue pitocin 2x2 -will obtain PEC labs, IVmed protocol ordered and advised RN if persistent severe range develop will start MgSO4 4/2

## 2019-09-16 ENCOUNTER — Inpatient Hospital Stay (HOSPITAL_COMMUNITY): Payer: Medicaid Other | Admitting: Anesthesiology

## 2019-09-16 ENCOUNTER — Encounter (HOSPITAL_COMMUNITY): Payer: Self-pay

## 2019-09-16 DIAGNOSIS — Z3490 Encounter for supervision of normal pregnancy, unspecified, unspecified trimester: Secondary | ICD-10-CM

## 2019-09-16 MED ORDER — WITCH HAZEL-GLYCERIN EX PADS: 1.0000 "application " | MEDICATED_PAD | CUTANEOUS | Status: DC | PRN

## 2019-09-16 MED ORDER — OXYCODONE HCL 5 MG/5ML PO SOLN: 5.0000 mg | ORAL | Status: DC | PRN

## 2019-09-16 MED ORDER — MAGNESIUM SULFATE 40 GM/1000ML IV SOLN
2.0000 g/h | INTRAVENOUS | Status: AC
  Administered 2019-09-17: 2 g/h via INTRAVENOUS
  Filled 2019-09-16 (×2): qty 1000

## 2019-09-16 MED ORDER — COCONUT OIL OIL
1.0000 "application " | TOPICAL_OIL | Status: DC | PRN
  Administered 2019-09-18: 1 via TOPICAL

## 2019-09-16 MED ORDER — MAGNESIUM SULFATE BOLUS VIA INFUSION
4.0000 g | Freq: Once | INTRAVENOUS | Status: AC
  Administered 2019-09-17: 4 g via INTRAVENOUS
  Filled 2019-09-16: qty 1000

## 2019-09-16 MED ORDER — ZOLPIDEM TARTRATE 5 MG PO TABS: 5.0000 mg | ORAL_TABLET | Freq: Every evening | ORAL | Status: DC | PRN

## 2019-09-16 MED ORDER — LACTATED RINGERS IV SOLN
INTRAVENOUS | Status: DC
  Administered 2019-09-17: 16:00:00 via INTRAVENOUS
  Administered 2019-09-17: 50 mL/h via INTRAVENOUS

## 2019-09-16 MED ORDER — TETANUS-DIPHTH-ACELL PERTUSSIS 5-2.5-18.5 LF-MCG/0.5 IM SUSP: 0.5000 mL | Freq: Once | INTRAMUSCULAR | Status: DC

## 2019-09-16 MED ORDER — LABETALOL HCL 5 MG/ML IV SOLN
20.0000 mg | Freq: Once | INTRAVENOUS | Status: AC
  Administered 2019-09-16: 20 mg via INTRAVENOUS
  Filled 2019-09-16: qty 4

## 2019-09-16 MED ORDER — SENNOSIDES-DOCUSATE SODIUM 8.6-50 MG PO TABS
2.0000 | ORAL_TABLET | ORAL | Status: DC
  Administered 2019-09-16 – 2019-09-17 (×2): 2 via ORAL
  Filled 2019-09-16 (×2): qty 2

## 2019-09-16 MED ORDER — SIMETHICONE 80 MG PO CHEW: 80.0000 mg | CHEWABLE_TABLET | ORAL | Status: DC | PRN

## 2019-09-16 MED ORDER — MEASLES, MUMPS & RUBELLA VAC IJ SOLR
0.5000 mL | Freq: Once | INTRAMUSCULAR | Status: AC
  Administered 2019-09-18: 0.5 mL via SUBCUTANEOUS
  Filled 2019-09-16: qty 0.5

## 2019-09-16 MED ORDER — DIBUCAINE (PERIANAL) 1 % EX OINT: 1.0000 "application " | TOPICAL_OINTMENT | CUTANEOUS | Status: DC | PRN

## 2019-09-16 MED ORDER — LACTATED RINGERS AMNIOINFUSION
INTRAVENOUS | Status: DC
  Administered 2019-09-16: 11:00:00 via INTRAUTERINE

## 2019-09-16 MED ORDER — ACETAMINOPHEN 160 MG/5ML PO SOLN: 325.0000 mg | ORAL | Status: DC | PRN

## 2019-09-16 MED ORDER — OXYCODONE HCL 5 MG/5ML PO SOLN: 10.0000 mg | ORAL | Status: DC | PRN

## 2019-09-16 MED ORDER — IBUPROFEN 600 MG PO TABS: 600.0000 mg | ORAL_TABLET | Freq: Four times a day (QID) | ORAL | Status: DC

## 2019-09-16 MED ORDER — ONDANSETRON HCL 4 MG PO TABS: 4.0000 mg | ORAL_TABLET | ORAL | Status: DC | PRN

## 2019-09-16 MED ORDER — ACETAMINOPHEN 160 MG/5ML PO SOLN: 650.0000 mg | ORAL | Status: DC | PRN

## 2019-09-16 MED ORDER — ACETAMINOPHEN 325 MG PO TABS
650.0000 mg | ORAL_TABLET | ORAL | Status: DC | PRN
  Administered 2019-09-16: 650 mg via ORAL
  Filled 2019-09-16: qty 2

## 2019-09-16 MED ORDER — SODIUM CHLORIDE (PF) 0.9 % IJ SOLN
INTRAMUSCULAR | Status: DC | PRN
  Administered 2019-09-16: 12 mL/h via EPIDURAL

## 2019-09-16 MED ORDER — LABETALOL HCL 5 MG/ML IV SOLN: 20.0000 mg | INTRAVENOUS | Status: DC | PRN

## 2019-09-16 MED ORDER — HYDRALAZINE HCL 20 MG/ML IJ SOLN: 10.0000 mg | INTRAMUSCULAR | Status: DC | PRN

## 2019-09-16 MED ORDER — OXYCODONE-ACETAMINOPHEN 5-325 MG PO TABS: 2.0000 | ORAL_TABLET | ORAL | Status: DC | PRN

## 2019-09-16 MED ORDER — OXYCODONE-ACETAMINOPHEN 5-325 MG PO TABS: 1.0000 | ORAL_TABLET | ORAL | Status: DC | PRN

## 2019-09-16 MED ORDER — LIDOCAINE HCL (PF) 1 % IJ SOLN
INTRAMUSCULAR | Status: DC | PRN
  Administered 2019-09-16: 5 mL via EPIDURAL

## 2019-09-16 MED ORDER — BENZOCAINE-MENTHOL 20-0.5 % EX AERO
1.0000 "application " | INHALATION_SPRAY | CUTANEOUS | Status: DC | PRN
  Administered 2019-09-16: 1 via TOPICAL
  Filled 2019-09-16: qty 56

## 2019-09-16 MED ORDER — ONDANSETRON HCL 4 MG/2ML IJ SOLN: 4.0000 mg | INTRAMUSCULAR | Status: DC | PRN

## 2019-09-16 MED ORDER — IBUPROFEN 100 MG/5ML PO SUSP
600.0000 mg | Freq: Four times a day (QID) | ORAL | Status: DC
  Administered 2019-09-16 – 2019-09-18 (×7): 600 mg via ORAL
  Filled 2019-09-16 (×7): qty 30

## 2019-09-16 MED ORDER — LABETALOL HCL 5 MG/ML IV SOLN: 80.0000 mg | INTRAVENOUS | Status: DC | PRN

## 2019-09-16 MED ORDER — LABETALOL HCL 5 MG/ML IV SOLN: 40.0000 mg | INTRAVENOUS | Status: DC | PRN

## 2019-09-16 NOTE — Progress Notes (Signed)
Pt lying on left side, cuff moved to wrist on left arm

## 2019-09-16 NOTE — Progress Notes (Signed)
Pt comfortable with epidural. B/p still elevated. FHTs- reactive Cx-C/rim/-1 ROA, Thick mec PLAN/ IUPC placed. Will start amnioinfusion.

## 2019-09-16 NOTE — Progress Notes (Signed)
This note also relates to the following rows which could not be included: Dose (milli-units/min) Oxytocin - Cannot attach notes to extension rows Rate (mL/hr) Oxytocin - Cannot attach notes to extension rows Concentration Oxytocin - Cannot attach notes to extension rows  Pt pushing in left tilt, holding both legs, progress being made.

## 2019-09-16 NOTE — Progress Notes (Signed)
Anesthesia MD and OBGYN consulted about maternal HR increase after fluid bolus.  Urine output, HR and BP discussed.  Fluid reduced to total 116ml/hr and strict I&O ordered.  FSE to be placed to ensure fetal tracing while mother HR is in 120's.

## 2019-09-16 NOTE — Progress Notes (Signed)
SVD baby girl to warmer with peds at [facility]

## 2019-09-16 NOTE — Anesthesia Preprocedure Evaluation (Addendum)
Anesthesia Evaluation  Patient identified by MRN, date of birth, ID band Patient awake    Reviewed: Allergy & Precautions, NPO status , Patient's Chart, lab work & pertinent test results  Airway Mallampati: II  TM Distance: >3 FB Neck ROM: Full    Dental no notable dental hx. (+) Teeth Intact   Pulmonary neg pulmonary ROS,    Pulmonary exam normal breath sounds clear to auscultation       Cardiovascular hypertension, Pt. on medications and Pt. on home beta blockers Normal cardiovascular exam Rhythm:Regular Rate:Normal     Neuro/Psych negative neurological ROS  negative psych ROS   GI/Hepatic negative GI ROS, Neg liver ROS,   Endo/Other  negative endocrine ROS  Renal/GU negative Renal ROS     Musculoskeletal   Abdominal (+) + obese,   Peds  Hematology Hgb 9.9 Plt 250   Anesthesia Other Findings   Reproductive/Obstetrics (+) Pregnancy                            Anesthesia Physical Anesthesia Plan  ASA: III  Anesthesia Plan: Epidural   Post-op Pain Management:    Induction:   PONV Risk Score and Plan:   Airway Management Planned:   Additional Equipment:   Intra-op Plan:   Post-operative Plan:   Informed Consent: I have reviewed the patients History and Physical, chart, labs and discussed the procedure including the risks, benefits and alternatives for the proposed anesthesia with the patient or authorized representative who has indicated his/her understanding and acceptance.       Plan Discussed with:   Anesthesia Plan Comments: ($0 2/7 wk G1P0 w PIH for LEA)        Anesthesia Quick Evaluation

## 2019-09-16 NOTE — Anesthesia Procedure Notes (Addendum)

## 2019-09-16 NOTE — Progress Notes (Signed)
Pt pushing with good effort, small crown when pushing.

## 2019-09-16 NOTE — Progress Notes (Signed)
Pt feeling nauseous and lightheaded when sitting up.  Pt repositioned and BP cuff repositioned.

## 2019-09-16 NOTE — Lactation Note (Signed)
This note was copied from a baby's chart. Lactation Consultation Note  Patient Name: Jacqueline Peck [id number] Date: 09/16/2019 Reason for consult: Initial assessment;Term;Primapara;1st time breastfeeding  P1 mother whose infant is now 62 hours old.  RN had just assisted baby to latch in the football hold on the right breast when I arrived.  Baby had a wide gape and flanged lips with rhythmic sucking.  Mother denied pain with latching.  Demonstrated breast compressions and mother able to return demonstrate.  Discussed basic breast feeding concepts and observed baby feeding for 15 minutes before I left the room.  Encouraged feeding 8-12 times/24 hours or sooner if baby shows cues.  Reviewed feeding cues.  Mother is familiar with hand expression and I encouraged this before/after feedings to help with milk supply.  Colostrum container provided and milk storage times reviewed.  Finger feeding demonstrated.  Suggested mother call for latch assistance as needed.  She does not have a DEBP for home use.  Mother is a [facility] participant in [place].  Father informed me that, if mother needs a pump, they will purchase one on their own because they plan to obtain the formula package from [facility].  I discussed the importance of purchasing a high quality pump if they purchase one.  Father verbalized understanding.     Maternal Data Formula Feeding for Exclusion: Yes Reason for exclusion: Mother's choice to formula and breast feed on admission Has patient been taught Hand Expression?: Yes Does the patient have breastfeeding experience prior to this delivery?: No  Feeding Feeding Type: Breast Fed  LATCH Score Latch: Grasps breast easily, tongue down, lips flanged, rhythmical sucking.  Audible Swallowing: A few with stimulation  Type of Nipple: Everted at rest and after stimulation  Comfort (Breast/Nipple): Soft / non-tender  Hold (Positioning): Assistance needed to correctly position infant at  breast and maintain latch.  LATCH Score: 8  Interventions Interventions: Breast feeding basics reviewed;Skin to skin;Breast massage;Hand express;Breast compression;Support pillows  Lactation Tools Discussed/Used WIC Program: Yes   Consult Status Consult Status: Follow-up Date: 09/17/19 Follow-up type: In-patient    Little Ishikawa 09/16/2019, 8:58 PM

## 2019-09-16 NOTE — Progress Notes (Signed)
First push, left lateral.  Pt coached on pushing technique.  minimal effort and progress made.

## 2019-09-17 LAB — COMPREHENSIVE METABOLIC PANEL
ALT: 10 U/L (ref 0–44)
ALT: 12 U/L (ref 0–44)
AST: 21 U/L (ref 15–41)
AST: 22 U/L (ref 15–41)
Albumin: 1.7 g/dL — ABNORMAL LOW (ref 3.5–5.0)
Albumin: 1.9 g/dL — ABNORMAL LOW (ref 3.5–5.0)
Alkaline Phosphatase: 118 U/L (ref 38–126)
Alkaline Phosphatase: 139 U/L — ABNORMAL HIGH (ref 38–126)
Anion gap: 10 (ref 5–15)
Anion gap: 10 (ref 5–15)
BUN: 11 mg/dL (ref 6–20)
BUN: 10 mg/dL (ref 6–20)
CO2: 22 mmol/L (ref 22–32)
CO2: 21 mmol/L — ABNORMAL LOW (ref 22–32)
Calcium: 8.1 mg/dL — ABNORMAL LOW (ref 8.9–10.3)
Calcium: 7.7 mg/dL — ABNORMAL LOW (ref 8.9–10.3)
Chloride: 102 mmol/L (ref 98–111)
Chloride: 103 mmol/L (ref 98–111)
Creatinine, Ser: 1.11 mg/dL — ABNORMAL HIGH (ref 0.44–1.00)
Creatinine, Ser: 1.05 mg/dL — ABNORMAL HIGH (ref 0.44–1.00)
GFR calc Af Amer: >60 mL/min (ref >60)
GFR calc Af Amer: >60 mL/min (ref >60)
GFR calc non Af Amer: >60 mL/min (ref >60)
GFR calc non Af Amer: >60 mL/min (ref >60)
Glucose, Bld: 110 mg/dL — ABNORMAL HIGH (ref 70–99)
Glucose, Bld: 158 mg/dL — ABNORMAL HIGH (ref 70–99)
Potassium: 3.6 mmol/L (ref 3.5–5.1)
Potassium: 3.4 mmol/L — ABNORMAL LOW (ref 3.5–5.1)
Sodium: 134 mmol/L — ABNORMAL LOW (ref 135–145)
Sodium: 134 mmol/L — ABNORMAL LOW (ref 135–145)
Total Bilirubin: 0.7 mg/dL (ref 0.3–1.2)
Total Bilirubin: 0.3 mg/dL (ref 0.3–1.2)
Total Protein: 4.7 g/dL — ABNORMAL LOW (ref 6.5–8.1)
Total Protein: 5.3 g/dL — ABNORMAL LOW (ref 6.5–8.1)

## 2019-09-17 LAB — CBC
HCT: 24.4 % — ABNORMAL LOW (ref 36.0–46.0)
Hemoglobin: 7.5 g/dL — ABNORMAL LOW (ref 12.0–15.0)
MCH: 25.8 pg — ABNORMAL LOW (ref 26.0–34.0)
MCHC: 30.7 g/dL (ref 30.0–36.0)
MCV: 83.8 fL (ref 80.0–100.0)
Platelets: 216 10*3/uL (ref 150–400)
RBC: 2.91 MIL/uL — ABNORMAL LOW (ref 3.87–5.11)
RDW: 15.1 % (ref 11.5–15.5)
WBC: 17.4 10*3/uL — ABNORMAL HIGH (ref 4.0–10.5)
nRBC: 0 % (ref 0.0–0.2)

## 2019-09-17 LAB — MAGNESIUM: Magnesium: 6.3 mg/dL (ref 1.7–2.4)

## 2019-09-17 MED ORDER — INFLUENZA VAC SPLIT QUAD 0.5 ML IM SUSY
0.5000 mL | PREFILLED_SYRINGE | INTRAMUSCULAR | Status: AC
  Administered 2019-09-18: 0.5 mL via INTRAMUSCULAR
  Filled 2019-09-17: qty 0.5

## 2019-09-17 NOTE — Progress Notes (Signed)
Patient is doing well.  She is ambulating, voiding, tolerating PO.  Pain control is good.  Lochia is appropriate.  Approximately 4 hours after delivery, she received 55m IV labetalol and was started on magnesium sulfate for presumed severe preeclampsia.  She did have isolated severe range BPs during day 1 of her induction, but they were not sustained so she did not receive any anti-hypertensive therapy at that time  Currently, she reports that she feels well.  Denies headache, visual changes, nausea, epigastric pain, SOB.   Vitals:   09/17/19 0530 09/17/19 0631 09/17/19 0742 09/17/19 0743  BP: (!) 111/55 (!) 146/99 (!) 136/101 131/87  Pulse: 63 85 68 64  Resp: 16 18 18    Temp:  98.2 F (36.8 C) 97.6 F (36.4 C)   TempSrc:  Oral    SpO2: 96% 100% 100%   Weight:      Height:       Urine output: 80-100 mL / hr  NAD RRR CTAB Fundus firm Ext: 1+ edema bilaterally, 2+ DTRs, no clonus  Lab Results  Component Value Date   WBC 17.4 (H) 09/17/2019   HGB 7.5 (L) 09/17/2019   HCT 24.4 (L) 09/17/2019   MCV 83.8 09/17/2019   PLT 216 09/17/2019    --/--/O POS, O POS [facility] [address], [place] [address] (11/23 0840)/RNI  A/P 23y.o. G1P1001 PPD#1. Severe preeclampsia--Creatinine was increased this AM to 1.1 from 0.83.  Currently urine output is adequate and no s/sx of magnesium toxicity.  Will consider repeating CMP / Mg level this afternoon, sooner prn  RNI--MMR prior to discharge  Routine care.      [place]

## 2019-09-17 NOTE — Plan of Care (Signed)
  Problem: Clinical Measurements: Goal: Respiratory complications will improve Outcome: Completed/Met   Problem: Coping: Goal: Level of anxiety will decrease Outcome: Completed/Met   Problem: Pain Managment: Goal: General experience of comfort will improve Outcome: Completed/Met   Problem: Coping: Goal: Ability to identify and utilize available resources and services will improve Outcome: Completed/Met   Problem: Role Relationship: Goal: Ability to demonstrate positive interaction with newborn will improve Outcome: Completed/Met

## 2019-09-17 NOTE — Lactation Note (Signed)
This note was copied from a baby's chart. Lactation Consultation Note  Patient Name: Jacqueline Peck [id number] Date: 09/17/2019 Reason for consult: Follow-up assessment;Primapara;1st time breastfeeding;Term;Other (Comment)(no weight loss) Baby is 16 hours  LC reviewed the do flow sheets. Per mom the last feeding was at 6:39.  Voided x 1 and HNS at 16 hours of life.  LC reviewed basics of latching and assisted mom to latch in the cross cradle  Position. Increased swallows early into the latch and with breast compressions.  Per mom comfortable. Baby fed for 15 mins , baby released on her own and nipple well rounded. Baby acting hungry, re-latched with assistance and depth achieved with swallows and per mom comfort.  Per mom feeding preference was breast / formula per mom because she was sure how breast feeding would work .  Seltzer praised mom for how well she is doing.  Per mom had several breast changes with pregnancy.  Per mom active with WIC , does not have a pump at home.     Maternal Data    Feeding Feeding Type: Breast Fed  LATCH Score                   Interventions    Lactation Tools Discussed/Used WIC Program: Yes   Consult Status Consult Status: Follow-up Date: 09/18/19 Follow-up type: In-patient    [place] 09/17/2019, 9:31 AM

## 2019-09-17 NOTE — Progress Notes (Signed)
Called Dr. Ouida Sills and informed him of patient elevated blood pressures and got an order to give labetalol 20mg  IV and if that doesn't help to follow protocol.

## 2019-09-17 NOTE — Anesthesia Postprocedure Evaluation (Signed)
Anesthesia Post Note  Patient: Jacqueline Peck  Procedure(s) Performed: AN AD HOC LABOR EPIDURAL     Patient location during evaluation: Mother Baby Anesthesia Type: Epidural Level of consciousness: awake and alert, awake and oriented Pain management: pain level controlled Vital Signs Assessment: post-procedure vital signs reviewed and stable Respiratory status: spontaneous breathing and respiratory function stable Cardiovascular status: blood pressure returned to baseline Postop Assessment: no headache, epidural receding, patient able to bend at knees, adequate PO intake, no backache, no apparent nausea or vomiting and able to ambulate Anesthetic complications: no    Last Vitals:  Vitals:   09/17/19 0742 09/17/19 0743  BP: (!) 136/101 131/87  Pulse: 68 64  Resp: 18   Temp: 36.4 C   SpO2: 100%     Last Pain:  Vitals:   09/17/19 0740  TempSrc:   PainSc: 0-No pain   Pain Goal: Patients Stated Pain Goal: 3 (09/17/19 0740)                 Bufford Spikes

## 2019-09-17 NOTE — Progress Notes (Signed)
Got a call from Dr. Ouida Sills saying that he wants pt transferred to [facility] and for mag to be started down there due to elevated blood pressures. Pt to be transferred to room 111 at 2300. Informed patient, patient aware and understood.

## 2019-09-18 MED ORDER — DOCUSATE SODIUM 100 MG PO CAPS: 100.0000 mg | ORAL_CAPSULE | Freq: Two times a day (BID) | ORAL | 0 refills | Status: DC

## 2019-09-18 MED ORDER — IBUPROFEN 600 MG PO TABS: 600.0000 mg | ORAL_TABLET | Freq: Four times a day (QID) | ORAL | 0 refills | Status: DC | PRN

## 2019-09-18 NOTE — Discharge Summary (Signed)
Obstetric Discharge Summary Reason for Admission: induction of labor Prenatal Procedures: NST and ultrasound Intrapartum Procedures: spontaneous vaginal delivery with vacuum Postpartum Procedures: none Complications-Operative and Postpartum: 2nd degree perineal laceration Hemoglobin  Date Value Ref Range Status  09/17/2019 7.5 (L) 12.0 - 15.0 g/dL Final    Comment:    REPEATED TO VERIFY   HCT  Date Value Ref Range Status  09/17/2019 24.4 (L) 36.0 - 46.0 % Final    Physical Exam:  General: alert, cooperative and appears stated age 23: appropriate Uterine Fundus: firm DVT Evaluation: No evidence of DVT seen on physical exam.  Discharge Diagnoses: Term Pregnancy-delivered  Discharge Information: Date: 09/18/2019 Activity: pelvic rest Diet: routine Medications: Ibuprofen and Colace Condition: improved Instructions: refer to practice specific booklet Discharge to: home Follow-up Information    Vanessa Kick, MD Follow up on 09/23/2019.   Specialty: Obstetrics and Gynecology Why: For a BP check. Office to call with appointment time Contact information: [place] [address] [phone]        Olga Millers, MD Follow up in 4 week(s).   Specialty: Obstetrics and Gynecology Why: For a postpartum appointment  Contact information: [place] [id number] [phone]           Newborn Data: Live born female  Birth Weight: 8 lb 1.6 oz (3675 g) APGAR: 8, 9  Newborn Delivery   Birth date/time: 09/16/2019 17:01:00 Delivery type: Vaginal, Vacuum (Extractor)      Home with mother.  Vanessa Kick 09/18/2019, 11:42 AM

## 2019-09-18 NOTE — Lactation Note (Signed)
This note was copied from a baby's chart. Lactation Consultation Note  Patient Name: Jacqueline Peck [id number] Date: 09/18/2019 Reason for consult: Follow-up assessment;Primapara;1st time breastfeeding;Term;Infant weight loss;Other (Comment)(2 % weight loss) Baby is 79 hours old  LC reviewed and updated the doc flow sheets.  Baby awake and hungry. LC changed a stool diaper and assisted mom to  Latch on the right breast / football / depth achieved / multiple swallows / increased with breast compressions/ and baby fed for 23 mins / released on her own. Nipple well rounded and per mom comfortable.  Per mom nipples alittle sensitive , increased swallows noted and no breakdown.  LC suspected and mentioned to mom it is probably transient soreness due to the milk coming in.  Sore nipple and engorgement prevention and tx reviewed.  LC instructed mom on the use shells between feedings except when sleeping and hand pump, increasing flange size to #27 for when milk comes in.  Storage of breast milk reviewed and mom aware of the [facility] services and resource phone numbers.  LC praised  Mom for her breast feeding efforts and encouraged mom to give the baby lots of practice at the breast.  Baby was supplemented once during the night due to  Mom feeling very tired.   Maternal Data Has patient been taught Hand Expression?: Yes  Feeding Feeding Type: Breast Fed  LATCH Score Latch: Grasps breast easily, tongue down, lips flanged, rhythmical sucking.  Audible Swallowing: Spontaneous and intermittent  Type of Nipple: Everted at rest and after stimulation  Comfort (Breast/Nipple): Filling, red/small blisters or bruises, mild/mod discomfort  Hold (Positioning): Assistance needed to correctly position infant at breast and maintain latch.  LATCH Score: 8  Interventions Interventions: Breast feeding basics reviewed  Lactation Tools Discussed/Used Tools: Pump;Shells;Flanges Flange Size: 24;27 Shell Type:  Inverted Breast pump type: Manual WIC Program: Yes Pump Review: Setup, frequency, and cleaning;Milk Storage Initiated by:: mai Date initiated:: 09/18/19   Consult Status Consult Status: Complete Date: 09/18/19    Myer Haff 09/18/2019, 10:38 AM

## 2019-12-21 ENCOUNTER — Encounter (HOSPITAL_BASED_OUTPATIENT_CLINIC_OR_DEPARTMENT_OTHER): Payer: Self-pay

## 2019-12-21 ENCOUNTER — Other Ambulatory Visit: Payer: Self-pay

## 2019-12-21 ENCOUNTER — Emergency Department (HOSPITAL_BASED_OUTPATIENT_CLINIC_OR_DEPARTMENT_OTHER)
Admission: EM | Admit: 2019-12-21 | Discharge: 2019-12-21 | Disposition: A | Discharge status: Home / Self Care | Payer: Medicaid Other | Attending: Emergency Medicine | Admitting: Emergency Medicine

## 2019-12-21 ENCOUNTER — Emergency Department (HOSPITAL_BASED_OUTPATIENT_CLINIC_OR_DEPARTMENT_OTHER): Payer: Medicaid Other

## 2019-12-21 DIAGNOSIS — K8050 Calculus of bile duct without cholangitis or cholecystitis without obstruction: Secondary | ICD-10-CM | POA: Diagnosis not present

## 2019-12-21 DIAGNOSIS — R1011 Right upper quadrant pain: Secondary | ICD-10-CM

## 2019-12-21 DIAGNOSIS — K8020 Calculus of gallbladder without cholecystitis without obstruction: Secondary | ICD-10-CM | POA: Diagnosis not present

## 2019-12-21 LAB — CBC WITH DIFFERENTIAL/PLATELET
Abs Immature Granulocytes: 0.03 10*3/uL (ref 0.00–0.07)
Basophils Absolute: 0.1 10*3/uL (ref 0.0–0.1)
Basophils Relative: 1 %
Eosinophils Absolute: 0.6 10*3/uL — ABNORMAL HIGH (ref 0.0–0.5)
Eosinophils Relative: 6 %
HCT: 38.4 % (ref 36.0–46.0)
Hemoglobin: 11.8 g/dL — ABNORMAL LOW (ref 12.0–15.0)
Immature Granulocytes: 0 %
Lymphocytes Relative: 30 %
Lymphs Abs: 2.9 10*3/uL (ref 0.7–4.0)
MCH: 25.6 pg — ABNORMAL LOW (ref 26.0–34.0)
MCHC: 30.7 g/dL (ref 30.0–36.0)
MCV: 83.3 fL (ref 80.0–100.0)
Monocytes Absolute: 0.5 10*3/uL (ref 0.1–1.0)
Monocytes Relative: 5 %
Neutro Abs: 5.5 10*3/uL (ref 1.7–7.7)
Neutrophils Relative %: 58 %
Platelets: 348 10*3/uL (ref 150–400)
RBC: 4.61 MIL/uL (ref 3.87–5.11)
RDW: 15.3 % (ref 11.5–15.5)
WBC: 9.5 10*3/uL (ref 4.0–10.5)
nRBC: 0 % (ref 0.0–0.2)

## 2019-12-21 LAB — COMPREHENSIVE METABOLIC PANEL
ALT: 20 U/L (ref 0–44)
AST: 22 U/L (ref 15–41)
Albumin: 3.8 g/dL (ref 3.5–5.0)
Alkaline Phosphatase: 105 U/L (ref 38–126)
Anion gap: 10 (ref 5–15)
BUN: 16 mg/dL (ref 6–20)
CO2: 24 mmol/L (ref 22–32)
Calcium: 9.1 mg/dL (ref 8.9–10.3)
Chloride: 104 mmol/L (ref 98–111)
Creatinine, Ser: 0.83 mg/dL (ref 0.44–1.00)
GFR calc Af Amer: >60 mL/min (ref >60)
GFR calc non Af Amer: >60 mL/min (ref >60)
Glucose, Bld: 101 mg/dL — ABNORMAL HIGH (ref 70–99)
Potassium: 4.6 mmol/L (ref 3.5–5.1)
Sodium: 138 mmol/L (ref 135–145)
Total Bilirubin: 0.2 mg/dL — ABNORMAL LOW (ref 0.3–1.2)
Total Protein: 7 g/dL (ref 6.5–8.1)

## 2019-12-21 LAB — URINALYSIS, ROUTINE W REFLEX MICROSCOPIC
Bilirubin Urine: NEGATIVE
Glucose, UA: NEGATIVE mg/dL
Hgb urine dipstick: NEGATIVE
Ketones, ur: NEGATIVE mg/dL
Leukocytes,Ua: NEGATIVE
Nitrite: NEGATIVE
Protein, ur: NEGATIVE mg/dL
Specific Gravity, Urine: 1.025 (ref 1.005–1.030)
pH: 6 (ref 5.0–8.0)

## 2019-12-21 LAB — PREGNANCY, URINE: Preg Test, Ur: NEGATIVE

## 2019-12-21 LAB — LIPASE, BLOOD: Lipase: 39 U/L (ref 11–51)

## 2019-12-21 NOTE — ED Notes (Signed)
Second collection of cbc unable to be resulted.  MD made aware.

## 2019-12-21 NOTE — ED Notes (Signed)
Attempt IV access unsuccessful, able to obtain needed lab specimens, pt declines further attempt for IV access unless needed for medication/fluid administration.

## 2019-12-21 NOTE — ED Provider Notes (Signed)
[facility] EMERGENCY DEPARTMENT Provider Note   CSN: [id number] Arrival date & time: 12/21/19  3299     History Chief Complaint  Patient presents with  . Abdominal Pain    Jacqueline Peck is a 24 y.o. female.  HPI Patient awakened this morning at 8 AM with fairly intense cramping mid abdominal pain.  She reports it was like spasms of pain.  She thought it was gas and tried a gas tablet.  She did not get any relief.  It has persisted now for several hours.  She reports that starting to localize more in her right upper quadrant.  Is very achy.  Patient has been having normal bowel movements.  No diarrhea.  Last normal bowel movement last night.  No vomiting.  Patient reports that she ate spaghetti with meat sauce last night for dinner.  She has not eaten this morning yet.  No pain burning urgency with urination.  No blood in the urine.  Patient is postpartum by approximately 3 months.  She reports she had a couple of episodes of similar pain but they resolve more quickly.  She always thought they were probably gas.  She reports however her sister had to have her gallbladder removed after one of her pregnancies as well.  She reports family members were concerned she might have a problem with her gallbladder.  Patient is currently nursing.  She is not on birth control.    History reviewed. No pertinent past medical history.  Patient Active Problem List   Diagnosis Date Noted  . Pregnancy 09/16/2019  . Polyhydramnios 09/15/2019    Past Surgical History:  Procedure Laterality Date  . NEPHRECTOMY Right      OB History    Gravida  1   Para  1   Term  1   Preterm      AB      Living  1     SAB      TAB      Ectopic      Multiple  0   Live Births  1           History reviewed. No pertinent family history.  Social History   Tobacco Use  . Smoking status: Never Smoker  . Smokeless tobacco: Never Used  Substance Use Topics  . Alcohol use: Not Currently    . Drug use: Never    Home Medications Prior to Admission medications   Medication Sig Start Date End Date Taking? Authorizing Provider  docusate sodium (COLACE) 100 MG capsule Take 1 capsule (100 mg total) by mouth 2 (two) times daily. 09/18/19   Waynard Reeds, MD  ibuprofen (ADVIL) 600 MG tablet Take 1 tablet (600 mg total) by mouth every 6 (six) hours as needed. 09/18/19   Waynard Reeds, MD    Allergies    Patient has no known allergies.  Review of Systems   Review of Systems 10 Systems reviewed and are negative for acute change except as noted in the HPI.  Physical Exam Updated Vital Signs BP (!) 140/102 (BP Location: Right Arm)   Pulse 83   Temp (!) 97.5 F (36.4 C) (Oral)   Resp 16   Ht 5\' 3"  (1.6 m)   Wt 86.2 kg   LMP 12/15/2019 (Approximate)   SpO2 98%   BMI 33.66 kg/m   Physical Exam Constitutional:      Comments: Alert nontoxic.  Mildly uncomfortable in appearance.  No respiratory distress.  HENT:  Head: Normocephalic and atraumatic.  Eyes:     Extraocular Movements: Extraocular movements intact.     Conjunctiva/sclera: Conjunctivae normal.  Cardiovascular:     Rate and Rhythm: Normal rate and regular rhythm.  Pulmonary:     Effort: Pulmonary effort is normal.     Breath sounds: Normal breath sounds.  Abdominal:     Comments: Tender to palpation in the right upper quadrant and epigastrium.  Lower abdomen nontender.  No guarding.  Musculoskeletal:        General: No swelling or tenderness. Normal range of motion.     Right lower leg: No edema.     Left lower leg: No edema.  Skin:    General: Skin is warm and dry.  Neurological:     General: No focal deficit present.     Mental Status: She is oriented to person, place, and time.     Coordination: Coordination normal.  Psychiatric:        Mood and Affect: Mood normal.     ED Results / Procedures / Treatments   Labs (all labs ordered are listed, but only abnormal results are displayed) Labs  Reviewed  COMPREHENSIVE METABOLIC PANEL - Abnormal; Notable for the following components:      Result Value   Glucose, Bld 101 (*)    Total Bilirubin 0.2 (*)    All other components within normal limits  CBC WITH DIFFERENTIAL/PLATELET - Abnormal; Notable for the following components:   Hemoglobin 11.8 (*)    MCH 25.6 (*)    Eosinophils Absolute 0.6 (*)    All other components within normal limits  URINALYSIS, ROUTINE W REFLEX MICROSCOPIC  PREGNANCY, URINE  LIPASE, BLOOD  CBC WITH DIFFERENTIAL/PLATELET    EKG None  Radiology US Abdomen Limited RUQ  Result Date: 12/21/2019 CLINICAL DATA:  Right upper quadrant pain EXAM: ULTRASOUND ABDOMEN LIMITED RIGHT UPPER QUADRANT COMPARISON:  None. FINDINGS: Gallbladder: Cholelithiasis without pericholecystic fluid or gallbladder wall thickening. Negative sonographic Murphy sign. Largest gallstone measures 4 mm. Common bile duct: Diameter: 2 mm Liver: No focal lesion identified. Within normal limits in parenchymal echogenicity. Portal vein is patent on color Doppler imaging with normal direction of blood flow towards the liver. Other: None. IMPRESSION: Cholelithiasis without sonographic evidence of acute cholecystitis. Electronically Signed   By: Kathreen Devoid   On: 12/21/2019 11:23    Procedures Procedures (including critical care time)  Medications Ordered in ED Medications - No data to display  ED Course  I have reviewed the triage vital signs and the nursing notes.  Pertinent labs & imaging results that were available during my care of the patient were reviewed by me and considered in my medical decision making (see chart for details).    MDM Rules/Calculators/A&P                     Ultrasound confirms stones in the gallbladder.  Pain episode is very consistent with biliary colic.  Patient's pain has significantly abated and nearly resolved without any medication administered in the emergency department.  By history, patient describes a  few intermittent self-limited episodes previously.  Patient does not have leukocytosis or guarding suggestive of cholecystitis.  No cholecystitis identified on the ultrasound.  Patient is counseled on signs and symptoms for which to return.  She is educated on cholecystitis, watching for fever, worsening pain or vomiting.  Patient is counseled on getting follow-up with surgery ASAP for evaluation for cholecystectomy.  She is counseled on risks of  retained stones and necessity to return immediately for worsening symptoms. Final Clinical Impression(s) / ED Diagnoses Final diagnoses:  Biliary colic    Rx / DC Orders ED Discharge Orders    None       Arby Barrette, MD 12/21/19 1317

## 2019-12-21 NOTE — Discharge Instructions (Addendum)
1.  You must have a no fat diet.  This includes dairy products such as cheese and high fat milk products.  This includes many pastries and any fried foods.  Fats trigger the gallbladder to contract and this causes episodes of pain and may move stones into your biliary ducts. 2.  You may take acetaminophen and Pepcid for pain.  Start taking Pepcid twice daily for the next 2 weeks.  Take acetaminophen needed. 3.  Return to the emergency department immediately if you have pain that is persisting more than a few hours.  Return if you develop any fever, recurrent vomiting, severe pain into your back or any other concerning symptoms. 4.  You need to schedule a follow-up appointment with [facility].  You will need evaluation for removal of your gallbladder.

## 2019-12-21 NOTE — ED Triage Notes (Signed)
Pt states awoke this morning with generalized abdominal pain, states has had similar pain before, resolved on its own., pt had baby in November, was treated for htn postpartum, took meds for one month, has not needed to continue.  Denies change in urinary symptoms.  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  Pt reports visible blood in stool last night, bright red.  Denies  n/v

## 2019-12-21 NOTE — ED Notes (Signed)
Lab specimens hemolyzed, to be recollected, pt in u/s at this time

## 2019-12-23 ENCOUNTER — Other Ambulatory Visit: Payer: Self-pay

## 2019-12-23 ENCOUNTER — Emergency Department (HOSPITAL_BASED_OUTPATIENT_CLINIC_OR_DEPARTMENT_OTHER)
Admission: EM | Admit: 2019-12-23 | Discharge: 2019-12-24 | Disposition: A | Discharge status: Home / Self Care | Payer: Medicaid Other | Attending: Emergency Medicine | Admitting: Emergency Medicine

## 2019-12-23 ENCOUNTER — Emergency Department (HOSPITAL_BASED_OUTPATIENT_CLINIC_OR_DEPARTMENT_OTHER): Payer: Medicaid Other

## 2019-12-23 ENCOUNTER — Encounter (HOSPITAL_BASED_OUTPATIENT_CLINIC_OR_DEPARTMENT_OTHER): Payer: Self-pay | Admitting: Oncology

## 2019-12-23 DIAGNOSIS — K8020 Calculus of gallbladder without cholecystitis without obstruction: Secondary | ICD-10-CM | POA: Diagnosis not present

## 2019-12-23 DIAGNOSIS — R509 Fever, unspecified: Secondary | ICD-10-CM | POA: Diagnosis not present

## 2019-12-23 DIAGNOSIS — Z20822 Contact with and (suspected) exposure to covid-19: Secondary | ICD-10-CM | POA: Diagnosis not present

## 2019-12-23 DIAGNOSIS — R1011 Right upper quadrant pain: Secondary | ICD-10-CM | POA: Insufficient documentation

## 2019-12-23 LAB — URINALYSIS, ROUTINE W REFLEX MICROSCOPIC
Bilirubin Urine: NEGATIVE
Bilirubin Urine: NEGATIVE
Glucose, UA: NEGATIVE mg/dL
Glucose, UA: NEGATIVE mg/dL
Hgb urine dipstick: NEGATIVE
Hgb urine dipstick: NEGATIVE
Ketones, ur: NEGATIVE mg/dL
Ketones, ur: NEGATIVE mg/dL
Nitrite: POSITIVE — AB
Nitrite: NEGATIVE
Protein, ur: 30 mg/dL — AB
Protein, ur: NEGATIVE mg/dL
Specific Gravity, Urine: >1.03 — ABNORMAL HIGH (ref 1.005–1.030)
Specific Gravity, Urine: 1.02 (ref 1.005–1.030)
pH: 6 (ref 5.0–8.0)
pH: 6.5 (ref 5.0–8.0)

## 2019-12-23 LAB — COMPREHENSIVE METABOLIC PANEL
ALT: 18 U/L (ref 0–44)
AST: 19 U/L (ref 15–41)
Albumin: 4 g/dL (ref 3.5–5.0)
Alkaline Phosphatase: 118 U/L (ref 38–126)
Anion gap: 11 (ref 5–15)
BUN: 13 mg/dL (ref 6–20)
CO2: 23 mmol/L (ref 22–32)
Calcium: 8.9 mg/dL (ref 8.9–10.3)
Chloride: 101 mmol/L (ref 98–111)
Creatinine, Ser: 0.91 mg/dL (ref 0.44–1.00)
GFR calc Af Amer: >60 mL/min (ref >60)
GFR calc non Af Amer: >60 mL/min (ref >60)
Glucose, Bld: 116 mg/dL — ABNORMAL HIGH (ref 70–99)
Potassium: 3.9 mmol/L (ref 3.5–5.1)
Sodium: 135 mmol/L (ref 135–145)
Total Bilirubin: 0.4 mg/dL (ref 0.3–1.2)
Total Protein: 8 g/dL (ref 6.5–8.1)

## 2019-12-23 LAB — CBC WITH DIFFERENTIAL/PLATELET
Abs Immature Granulocytes: 0.05 10*3/uL (ref 0.00–0.07)
Basophils Absolute: 0 10*3/uL (ref 0.0–0.1)
Basophils Relative: 0 %
Eosinophils Absolute: 0.4 10*3/uL (ref 0.0–0.5)
Eosinophils Relative: 3 %
HCT: 40.4 % (ref 36.0–46.0)
Hemoglobin: 12.6 g/dL (ref 12.0–15.0)
Immature Granulocytes: 0 %
Lymphocytes Relative: 20 %
Lymphs Abs: 2.7 10*3/uL (ref 0.7–4.0)
MCH: 26 pg (ref 26.0–34.0)
MCHC: 31.2 g/dL (ref 30.0–36.0)
MCV: 83.5 fL (ref 80.0–100.0)
Monocytes Absolute: 1.2 10*3/uL — ABNORMAL HIGH (ref 0.1–1.0)
Monocytes Relative: 9 %
Neutro Abs: 9 10*3/uL — ABNORMAL HIGH (ref 1.7–7.7)
Neutrophils Relative %: 68 %
Platelets: 337 10*3/uL (ref 150–400)
RBC: 4.84 MIL/uL (ref 3.87–5.11)
RDW: 15.5 % (ref 11.5–15.5)
WBC: 13.4 10*3/uL — ABNORMAL HIGH (ref 4.0–10.5)
nRBC: 0 % (ref 0.0–0.2)

## 2019-12-23 LAB — HCG, QUANTITATIVE, PREGNANCY: hCG, Beta Chain, Quant, S: 1 m[IU]/mL (ref <5)

## 2019-12-23 LAB — URINALYSIS, MICROSCOPIC (REFLEX): RBC / HPF: NONE SEEN RBC/hpf (ref 0–5)

## 2019-12-23 LAB — LIPASE, BLOOD: Lipase: 31 U/L (ref 11–51)

## 2019-12-23 LAB — SARS CORONAVIRUS 2 AG (30 MIN TAT): SARS Coronavirus 2 Ag: NEGATIVE

## 2019-12-23 LAB — LACTIC ACID, PLASMA: Lactic Acid, Venous: 1.3 mmol/L (ref 0.5–1.9)

## 2019-12-23 MED ORDER — SODIUM CHLORIDE 0.9 % IV BOLUS
1000.0000 mL | Freq: Once | INTRAVENOUS | Status: AC
  Administered 2019-12-23: 1000 mL via INTRAVENOUS

## 2019-12-23 MED ORDER — ACETAMINOPHEN 500 MG PO TABS
1000.0000 mg | ORAL_TABLET | Freq: Once | ORAL | Status: AC
  Administered 2019-12-23: 1000 mg via ORAL
  Filled 2019-12-23: qty 2

## 2019-12-23 NOTE — ED Provider Notes (Signed)
[facility] EMERGENCY DEPARTMENT Provider Note   CSN: [id number] Arrival date & time: 12/23/19  2035     History Chief Complaint  Patient presents with  . Fever    Jacqueline Peck is a 24 y.o. female.  HPI Patient reports last night she started to develop some subjective fever and generalized headache.  She reports she really did not feel too bad.  This evening she continued to have some headache and thought she had a fever although she did not take her temperature.  She reports she took TheraFlu at about 6 PM and really feels pretty good right now.  She reports overall she does not actually feel bad but she was diagnosed with gallstones 3 days ago and she want to make sure that this was not related to her gallbladder.  She reports she could actually just be coming down with something else but wanted to make sure that it was not something serious.  She is not been exposed any with Covid that she knows of.  She reports all of her sisters family was sick last week and she got sick for about a day.  She reports everybody was better within a day.  She denies she has generalized myalgia.  No sore throat.  She reports she is had just minimal amount of cough.  She denies she feels short of breath.  She is continuing to breast-feed.  She denies she is having breast pain redness or swelling.  She denies pain or swelling in her legs.    History reviewed. No pertinent past medical history.  Patient Active Problem List   Diagnosis Date Noted  . Pregnancy 09/16/2019  . Polyhydramnios 09/15/2019    Past Surgical History:  Procedure Laterality Date  . NEPHRECTOMY Right      OB History    Gravida  1   Para  1   Term  1   Preterm      AB      Living  1     SAB      TAB      Ectopic      Multiple  0   Live Births  1           No family history on file.  Social History   Tobacco Use  . Smoking status: Never Smoker  . Smokeless tobacco: Never Used  Substance Use  Topics  . Alcohol use: Not Currently  . Drug use: Never    Home Medications Prior to Admission medications   Medication Sig Start Date End Date Taking? Authorizing Provider  docusate sodium (COLACE) 100 MG capsule Take 1 capsule (100 mg total) by mouth 2 (two) times daily. 09/18/19   Waynard Reeds, MD  ibuprofen (ADVIL) 600 MG tablet Take 1 tablet (600 mg total) by mouth every 6 (six) hours as needed. 09/18/19   Waynard Reeds, MD  JENCYCLA 0.35 MG tablet Take 1 tablet by mouth daily. 10/22/19   [provider]  labetalol (NORMODYNE) 200 MG tablet Take 200 mg by mouth 2 (two) times daily. 09/24/19   [provider]    Allergies    Patient has no known allergies.  Review of Systems   Review of Systems 10 Systems reviewed and are negative for acute change except as noted in the HPI. Physical Exam Updated Vital Signs BP (!) 139/97   Pulse (!) 103   Temp (!) 100.5 F (38.1 C)   Resp 20   Ht 5\' 3"  (  1.6 m)   Wt 86.6 kg   LMP 12/07/2019 (Exact Date)   SpO2 100%   BMI 33.83 kg/m   Physical Exam Constitutional:      Comments: Patient is alert and nontoxic.  Ental status clear.  No respiratory distress.  HENT:     Head: Normocephalic and atraumatic.     Right Ear: Tympanic membrane normal.     Left Ear: Tympanic membrane normal.     Mouth/Throat:     Mouth: Mucous membranes are moist.     Pharynx: Oropharynx is clear.     Comments: No tonsillar exudates or erythema. Eyes:     Extraocular Movements: Extraocular movements intact.     Conjunctiva/sclera: Conjunctivae normal.     Pupils: Pupils are equal, round, and reactive to light.  Cardiovascular:     Pulses: Normal pulses.     Comments: Tachycardia.  No rub murmur gallop. Pulmonary:     Effort: Pulmonary effort is normal.     Breath sounds: Normal breath sounds.  Abdominal:     General: There is no distension.     Palpations: Abdomen is soft.     Tenderness: There is no abdominal tenderness. There is no  guarding.     Comments: No tenderness to deep palpation in the right upper quadrant.  She reports it feels slightly "uncomfortable" but she is anxious because I am pressing deeply.  Musculoskeletal:        General: No swelling or tenderness. Normal range of motion.     Right lower leg: No edema.     Left lower leg: No edema.  Skin:    General: Skin is warm and dry.  Neurological:     General: No focal deficit present.     Mental Status: She is oriented to person, place, and time.     Motor: No weakness.     Coordination: Coordination normal.  Psychiatric:        Mood and Affect: Mood normal.     ED Results / Procedures / Treatments   Labs (all labs ordered are listed, but only abnormal results are displayed) Labs Reviewed  COMPREHENSIVE METABOLIC PANEL - Abnormal; Notable for the following components:      Result Value   Glucose, Bld 116 (*)    All other components within normal limits  CBC WITH DIFFERENTIAL/PLATELET - Abnormal; Notable for the following components:   WBC 13.4 (*)    Neutro Abs 9.0 (*)    Monocytes Absolute 1.2 (*)    All other components within normal limits  URINALYSIS, ROUTINE W REFLEX MICROSCOPIC - Abnormal; Notable for the following components:   APPearance CLOUDY (*)    Specific Gravity, Urine >1.030 (*)    Protein, ur 30 (*)    Nitrite POSITIVE (*)    Leukocytes,Ua SMALL (*)    All other components within normal limits  URINALYSIS, MICROSCOPIC (REFLEX) - Abnormal; Notable for the following components:   Bacteria, UA MANY (*)    All other components within normal limits  URINALYSIS, ROUTINE W REFLEX MICROSCOPIC - Abnormal; Notable for the following components:   Leukocytes,Ua TRACE (*)    All other components within normal limits  URINALYSIS, MICROSCOPIC (REFLEX) - Abnormal; Notable for the following components:   Bacteria, UA RARE (*)    All other components within normal limits  SARS CORONAVIRUS 2 AG (30 MIN TAT)  CULTURE, BLOOD (ROUTINE X 2)    CULTURE, BLOOD (ROUTINE X 2)  SARS CORONAVIRUS 2 (TAT 6-24 HRS)  GROUP A STREP BY PCR  LIPASE, BLOOD  LACTIC ACID, PLASMA  HCG, QUANTITATIVE, PREGNANCY  LACTIC ACID, PLASMA  D-DIMER, QUANTITATIVE (NOT AT [facility])    EKG None  Radiology US Abdomen Limited  Result Date: 12/23/2019 CLINICAL DATA:  Fever chills EXAM: ULTRASOUND ABDOMEN LIMITED RIGHT UPPER QUADRANT COMPARISON:  12/21/2019 FINDINGS: Gallbladder: Contracted gallbladder with sludge and multiple stones. Gallbladder wall within normal limits. No sonographic Percell Miller indicated. Common bile duct: Diameter: 2 mm Liver: Not evaluated portal vein is patent on color Doppler imaging with normal direction of blood flow towards the liver. Other: None. IMPRESSION: Contracted gallbladder with sludge and stones but no sonographic features to suggest acute cholecystitis. Negative for biliary dilatation Electronically Signed   By: Donavan Foil M.D.   On: 12/23/2019 22:39   DG Chest Port 1 View  Result Date: 12/23/2019 CLINICAL DATA:  Fevers EXAM: PORTABLE CHEST 1 VIEW COMPARISON:  None. FINDINGS: The heart size and mediastinal contours are within normal limits. Both lungs are clear. The visualized skeletal structures are unremarkable. IMPRESSION: No active disease. Electronically Signed   By: Inez Catalina M.D.   On: 12/23/2019 21:38    Procedures Procedures (including critical care time)  Medications Ordered in ED Medications  acetaminophen (TYLENOL) tablet 1,000 mg (has no administration in time range)  sodium chloride 0.9 % bolus 1,000 mL (has no administration in time range)  sodium chloride 0.9 % bolus 1,000 mL ( Intravenous Stopped 12/23/19 2225)    ED Course  I have reviewed the triage vital signs and the nursing notes.  Pertinent labs & imaging results that were available during my care of the patient were reviewed by me and considered in my medical decision making (see chart for details).  Clinical Course as of Dec 22 2333  Tue Dec 23, 2019  2320 Patient reports she feels okay.  She has been resting.  Resting heart rate still in the 120s after first liter fluids.  Will repeat fluids and give Tylenol.   [MP]    Clinical Course User Index [MP] Charlesetta Shanks, MD   MDM Rules/Calculators/A&P                      Patient seen 3 days ago for right upper quadrant pain.  Found to have gallstones.  She has developed low-grade fever and mild headache.  Patient is tachycardic up to 120s.  Fluid resuscitation initiated.  Medically patient is mental status is clear and she is well in appearance. Urinalysis was repeated due to first contaminated specimen.  Repeat UA does not appear to show signs of infection.  Ultrasound does not show cholecystitis.  Chest x-ray is clear.  Patient does have leukocytosis today. At this time, unclear etiology for low-grade fever, mild diffuse headache and tachycardia.  Rapid Covid negative.  Will add 6 to 24-hour testing.  She has had sick contacts about a week ago.  No specifically identified Covid contact.  Will add rapid strep.  Will add D-dimer for tachycardia and low-grade fever without clear source.  Concern for possible PE and postpartum individual.  She does not have lower extremity pain or swelling.  Will initiate second liter of fluids and treat with Tylenol for fever. Dr. Florina Ou to review additional diagnostic results and reassess patient for final disposition. Final Clinical Impression(s) / ED Diagnoses Final diagnoses:  Fever    Rx / DC Orders ED Discharge Orders    None       Charlesetta Shanks, MD  12/31/19 0122  

## 2019-12-23 NOTE — ED Triage Notes (Signed)
Pt reports dx gallstones last week.  States today she developed fever, chills today and wanted to be checked out.  States thermostat had been set at 63 all night.  Pt took theraflu at 1800 tonight.

## 2019-12-23 NOTE — ED Notes (Signed)
MD did not want 2nd lactic acid

## 2019-12-23 NOTE — ED Notes (Signed)
Pt instructed on clean catch procedure again for repeat urine analysis per MD.

## 2019-12-24 ENCOUNTER — Emergency Department (HOSPITAL_BASED_OUTPATIENT_CLINIC_OR_DEPARTMENT_OTHER): Payer: Medicaid Other

## 2019-12-24 ENCOUNTER — Encounter (HOSPITAL_BASED_OUTPATIENT_CLINIC_OR_DEPARTMENT_OTHER): Payer: Self-pay

## 2019-12-24 DIAGNOSIS — R509 Fever, unspecified: Secondary | ICD-10-CM | POA: Diagnosis not present

## 2019-12-24 LAB — SARS CORONAVIRUS 2 (TAT 6-24 HRS): SARS Coronavirus 2: NEGATIVE

## 2019-12-24 LAB — D-DIMER, QUANTITATIVE: D-Dimer, Quant: 1.04 ug/mL-FEU — ABNORMAL HIGH (ref 0.00–0.50)

## 2019-12-24 LAB — GROUP A STREP BY PCR: Group A Strep by PCR: NOT DETECTED

## 2019-12-24 MED ORDER — IOHEXOL 350 MG/ML SOLN
75.0000 mL | Freq: Once | INTRAVENOUS | Status: AC | PRN
  Administered 2019-12-24: 75 mL via INTRAVENOUS

## 2019-12-24 NOTE — ED Provider Notes (Signed)
Nursing notes and vitals signs, including pulse oximetry, reviewed.  Summary of this visit's results, reviewed by myself:  EKG:  EKG Interpretation  Date/Time:    Ventricular Rate:    PR Interval:    QRS Duration:   QT Interval:    QTC Calculation:   R Axis:     Text Interpretation:         Labs:  Results for orders placed or performed during the hospital encounter of 12/23/19 (from the past 24 hour(s))  Comprehensive metabolic panel     Status: Abnormal   Collection Time: 12/23/19  9:01 PM  Result Value Ref Range   Sodium 135 135 - 145 mmol/L   Potassium 3.9 3.5 - 5.1 mmol/L   Chloride 101 98 - 111 mmol/L   CO2 23 22 - 32 mmol/L   Glucose, Bld 116 (H) 70 - 99 mg/dL   BUN 13 6 - 20 mg/dL   Creatinine, Ser 2.94 0.44 - 1.00 mg/dL   Calcium 8.9 8.9 - 76.5 mg/dL   Total Protein 8.0 6.5 - 8.1 g/dL   Albumin 4.0 3.5 - 5.0 g/dL   AST 19 15 - 41 U/L   ALT 18 0 - 44 U/L   Alkaline Phosphatase 118 38 - 126 U/L   Total Bilirubin 0.4 0.3 - 1.2 mg/dL   GFR calc non Af Amer >60 >60 mL/min   GFR calc Af Amer >60 >60 mL/min   Anion gap 11 5 - 15  Lipase, blood     Status: None   Collection Time: 12/23/19  9:01 PM  Result Value Ref Range   Lipase 31 11 - 51 U/L  Lactic acid, plasma     Status: None   Collection Time: 12/23/19  9:01 PM  Result Value Ref Range   Lactic Acid, Venous 1.3 0.5 - 1.9 mmol/L  CBC with Differential     Status: Abnormal   Collection Time: 12/23/19  9:01 PM  Result Value Ref Range   WBC 13.4 (H) 4.0 - 10.5 K/uL   RBC 4.84 3.87 - 5.11 MIL/uL   Hemoglobin 12.6 12.0 - 15.0 g/dL   HCT 46.5 03.5 - 46.5 %   MCV 83.5 80.0 - 100.0 fL   MCH 26.0 26.0 - 34.0 pg   MCHC 31.2 30.0 - 36.0 g/dL   RDW 68.1 27.5 - 17.0 %   Platelets 337 150 - 400 K/uL   nRBC 0.0 0.0 - 0.2 %   Neutrophils Relative % 68 %   Neutro Abs 9.0 (H) 1.7 - 7.7 K/uL   Lymphocytes Relative 20 %   Lymphs Abs 2.7 0.7 - 4.0 K/uL   Monocytes Relative 9 %   Monocytes Absolute 1.2 (H) 0.1 - 1.0  K/uL   Eosinophils Relative 3 %   Eosinophils Absolute 0.4 0.0 - 0.5 K/uL   Basophils Relative 0 %   Basophils Absolute 0.0 0.0 - 0.1 K/uL   Immature Granulocytes 0 %   Abs Immature Granulocytes 0.05 0.00 - 0.07 K/uL  Urinalysis, Routine w reflex microscopic     Status: Abnormal   Collection Time: 12/23/19  9:01 PM  Result Value Ref Range   Color, Urine YELLOW YELLOW   APPearance CLOUDY (A) CLEAR   Specific Gravity, Urine >1.030 (H) 1.005 - 1.030   pH 6.0 5.0 - 8.0   Glucose, UA NEGATIVE NEGATIVE mg/dL   Hgb urine dipstick NEGATIVE NEGATIVE   Bilirubin Urine NEGATIVE NEGATIVE   Ketones, ur NEGATIVE NEGATIVE mg/dL   Protein,  ur 30 (A) NEGATIVE mg/dL   Nitrite POSITIVE (A) NEGATIVE   Leukocytes,Ua SMALL (A) NEGATIVE  hCG, quantitative, pregnancy     Status: None   Collection Time: 12/23/19  9:01 PM  Result Value Ref Range   hCG, Beta Chain, Quant, S 1 <5 mIU/mL  Urinalysis, Microscopic (reflex)     Status: Abnormal   Collection Time: 12/23/19  9:01 PM  Result Value Ref Range   RBC / HPF 0-5 0 - 5 RBC/hpf   WBC, UA 11-20 0 - 5 WBC/hpf   Bacteria, UA MANY (A) NONE SEEN   Squamous Epithelial / LPF 11-20 0 - 5  SARS Coronavirus 2 Ag (30 min TAT) -     Status: None   Collection Time: 12/23/19  9:06 PM   Specimen: Nasal Swab  Result Value Ref Range   SARS Coronavirus 2 Ag NEGATIVE NEGATIVE  Urinalysis, Routine w reflex microscopic     Status: Abnormal   Collection Time: 12/23/19 10:28 PM  Result Value Ref Range   Color, Urine YELLOW YELLOW   APPearance CLEAR CLEAR   Specific Gravity, Urine 1.020 1.005 - 1.030   pH 6.5 5.0 - 8.0   Glucose, UA NEGATIVE NEGATIVE mg/dL   Hgb urine dipstick NEGATIVE NEGATIVE   Bilirubin Urine NEGATIVE NEGATIVE   Ketones, ur NEGATIVE NEGATIVE mg/dL   Protein, ur NEGATIVE NEGATIVE mg/dL   Nitrite NEGATIVE NEGATIVE   Leukocytes,Ua TRACE (A) NEGATIVE  Urinalysis, Microscopic (reflex)     Status: Abnormal   Collection Time: 12/23/19 10:28 PM   Result Value Ref Range   RBC / HPF NONE SEEN 0 - 5 RBC/hpf   WBC, UA 0-5 0 - 5 WBC/hpf   Bacteria, UA RARE (A) NONE SEEN   Squamous Epithelial / LPF 0-5 0 - 5  D-dimer, quantitative (not at [facility])     Status: Abnormal   Collection Time: 12/23/19 11:39 PM  Result Value Ref Range   D-Dimer, Quant 1.04 (H) 0.00 - 0.50 ug/mL-FEU  Group A Strep by PCR     Status: None   Collection Time: 12/23/19 11:39 PM   Specimen: Nasopharyngeal Swab; Sterile Swab  Result Value Ref Range   Group A Strep by PCR NOT DETECTED NOT DETECTED    Imaging Studies: CT Angio Chest PE W and/or Wo Contrast  Result Date: 12/24/2019 CLINICAL DATA:  Fever and chills EXAM: CT ANGIOGRAPHY CHEST WITH CONTRAST TECHNIQUE: Multidetector CT imaging of the chest was performed using the standard protocol during bolus administration of intravenous contrast. Multiplanar CT image reconstructions and MIPs were obtained to evaluate the vascular anatomy. CONTRAST:  70mL OMNIPAQUE IOHEXOL 350 MG/ML SOLN COMPARISON:  None. FINDINGS: Cardiovascular: There is a optimal opacification of the pulmonary arteries. There is no central,segmental, or subsegmental filling defects within the pulmonary arteries. The heart is normal in size. No pericardial effusion or thickening. No evidence right heart strain. There is normal three-vessel brachiocephalic anatomy without proximal stenosis. The thoracic aorta is normal in appearance. Mediastinum/Nodes: No hilar, mediastinal, or axillary adenopathy. Thyroid gland, trachea, and esophagus demonstrate no significant findings. Lungs/Pleura: The lungs are clear. No pleural effusion or pneumothorax. No airspace consolidation. Upper Abdomen: There appears to be a striated appearance to the left upper pole renal parenchyma with surrounding perinephric stranding. There is a punctate calcifications seen within the left upper pole. A contracted gallbladder seen with gallstones. Musculoskeletal: No chest wall abnormality.  Degenerative changes are seen in the lower thoracic spine with endplate irregularity/Schmorl's nodes. Review of the MIP  images confirms the above findings. IMPRESSION: 1. No central, segmental, or subsegmental pulmonary embolism. 2. No acute intrathoracic pathology to explain the patient's symptoms. 3. Findings which could be suggestive of left-sided  pyelonephritis. 4. Cholelithiasis Electronically Signed   By: Jonna Clark M.D.   On: 12/24/2019 01:31   US Abdomen Limited  Result Date: 12/23/2019 CLINICAL DATA:  Fever chills EXAM: ULTRASOUND ABDOMEN LIMITED RIGHT UPPER QUADRANT COMPARISON:  12/21/2019 FINDINGS: Gallbladder: Contracted gallbladder with sludge and multiple stones. Gallbladder wall within normal limits. No sonographic Eulah Pont indicated. Common bile duct: Diameter: 2 mm Liver: Not evaluated portal vein is patent on color Doppler imaging with normal direction of blood flow towards the liver. Other: None. IMPRESSION: Contracted gallbladder with sludge and stones but no sonographic features to suggest acute cholecystitis. Negative for biliary dilatation Electronically Signed   By: Jasmine Pang M.D.   On: 12/23/2019 22:39   DG Chest Port 1 View  Result Date: 12/23/2019 CLINICAL DATA:  Fevers EXAM: PORTABLE CHEST 1 VIEW COMPARISON:  None. FINDINGS: The heart size and mediastinal contours are within normal limits. Both lungs are clear. The visualized skeletal structures are unremarkable. IMPRESSION: No active disease. Electronically Signed   By: Alcide Clever M.D.   On: 12/23/2019 21:38   The source of the patient's fever is unclear.  Although the CT suggest pyelonephritis her urine shows no evidence of an infection.     Keawe Marcello, Jonny Ruiz, MD 12/24/19 [phone]

## 2019-12-25 ENCOUNTER — Encounter (HOSPITAL_COMMUNITY): Payer: Self-pay

## 2019-12-25 ENCOUNTER — Emergency Department (HOSPITAL_COMMUNITY)
Admission: EM | Admit: 2019-12-25 | Discharge: 2019-12-25 | Disposition: A | Discharge status: Home / Self Care | Payer: Medicaid Other | Attending: Emergency Medicine | Admitting: Emergency Medicine

## 2019-12-25 ENCOUNTER — Other Ambulatory Visit: Payer: Self-pay

## 2019-12-25 DIAGNOSIS — Z905 Acquired absence of kidney: Secondary | ICD-10-CM | POA: Insufficient documentation

## 2019-12-25 DIAGNOSIS — R1010 Upper abdominal pain, unspecified: Secondary | ICD-10-CM

## 2019-12-25 DIAGNOSIS — R1011 Right upper quadrant pain: Secondary | ICD-10-CM | POA: Insufficient documentation

## 2019-12-25 LAB — CBC
HCT: 38.4 % (ref 36.0–46.0)
Hemoglobin: 12.3 g/dL (ref 12.0–15.0)
MCH: 26.2 pg (ref 26.0–34.0)
MCHC: 32 g/dL (ref 30.0–36.0)
MCV: 81.9 fL (ref 80.0–100.0)
Platelets: 324 10*3/uL (ref 150–400)
RBC: 4.69 MIL/uL (ref 3.87–5.11)
RDW: 15.1 % (ref 11.5–15.5)
WBC: 11.5 10*3/uL — ABNORMAL HIGH (ref 4.0–10.5)
nRBC: 0 % (ref 0.0–0.2)

## 2019-12-25 LAB — COMPREHENSIVE METABOLIC PANEL
ALT: 22 U/L (ref 0–44)
AST: 20 U/L (ref 15–41)
Albumin: 4 g/dL (ref 3.5–5.0)
Alkaline Phosphatase: 98 U/L (ref 38–126)
Anion gap: 10 (ref 5–15)
BUN: 12 mg/dL (ref 6–20)
CO2: 24 mmol/L (ref 22–32)
Calcium: 9.3 mg/dL (ref 8.9–10.3)
Chloride: 106 mmol/L (ref 98–111)
Creatinine, Ser: 0.76 mg/dL (ref 0.44–1.00)
GFR calc Af Amer: >60 mL/min (ref >60)
GFR calc non Af Amer: >60 mL/min (ref >60)
Glucose, Bld: 98 mg/dL (ref 70–99)
Potassium: 3.9 mmol/L (ref 3.5–5.1)
Sodium: 140 mmol/L (ref 135–145)
Total Bilirubin: 0.5 mg/dL (ref 0.3–1.2)
Total Protein: 8 g/dL (ref 6.5–8.1)

## 2019-12-25 LAB — LIPASE, BLOOD: Lipase: 25 U/L (ref 11–51)

## 2019-12-25 MED ORDER — ONDANSETRON 4 MG PO TBDP: ORAL_TABLET | ORAL | 0 refills | Status: DC

## 2019-12-25 MED ORDER — OXYCODONE-ACETAMINOPHEN 5-325 MG PO TABS
1.0000 | ORAL_TABLET | Freq: Once | ORAL | Status: AC
  Administered 2019-12-25: 1 via ORAL
  Filled 2019-12-25: qty 1

## 2019-12-25 MED ORDER — SODIUM CHLORIDE 0.9% FLUSH: 3.0000 mL | Freq: Once | INTRAVENOUS | Status: DC

## 2019-12-25 MED ORDER — ONDANSETRON 4 MG PO TBDP
4.0000 mg | ORAL_TABLET | Freq: Once | ORAL | Status: AC
  Administered 2019-12-25: 4 mg via ORAL
  Filled 2019-12-25: qty 1

## 2019-12-25 MED ORDER — HYDROCODONE-ACETAMINOPHEN 5-325 MG PO TABS: 1.0000 | ORAL_TABLET | Freq: Four times a day (QID) | ORAL | 0 refills | Status: DC | PRN

## 2019-12-25 NOTE — ED Provider Notes (Signed)
[facility]-EMERGENCY DEPT Provider Note   CSN: [id number] Arrival date & time: 12/25/19  1239     History Chief Complaint  Patient presents with  . Abdominal Pain    Jacqueline Peck is a 24 y.o. female.  Patient complains of right upper quadrant pain.  She was diagnosed with gallstones recently and has an appointment with surgeon tomorrow  The history is provided by the patient. No language interpreter was used.  Abdominal Pain Pain location:  RUQ Pain quality: aching   Pain radiates to:  Does not radiate Pain severity:  Moderate Onset quality:  Sudden Timing:  Constant Chronicity:  New Context: not alcohol use   Relieved by:  Nothing Worsened by:  Nothing Ineffective treatments:  None tried Associated symptoms: no anorexia, no chest pain, no cough, no diarrhea, no fatigue and no hematuria   Risk factors: no alcohol abuse        History reviewed. No pertinent past medical history.  Patient Active Problem List   Diagnosis Date Noted  . Pregnancy 09/16/2019  . Polyhydramnios 09/15/2019    Past Surgical History:  Procedure Laterality Date  . NEPHRECTOMY Right      OB History    Gravida  1   Para  1   Term  1   Preterm      AB      Living  1     SAB      TAB      Ectopic      Multiple  0   Live Births  1           History reviewed. No pertinent family history.  Social History   Tobacco Use  . Smoking status: Never Smoker  . Smokeless tobacco: Never Used  Substance Use Topics  . Alcohol use: Not Currently  . Drug use: Never    Home Medications Prior to Admission medications   Medication Sig Start Date End Date Taking? Authorizing Provider  acetaminophen (TYLENOL) 500 MG tablet Take 500 mg by mouth every 6 (six) hours as needed for mild pain.   Yes [provider]  ibuprofen (ADVIL) 200 MG tablet Take 200 mg by mouth every 6 (six) hours as needed for fever or headache.   Yes [provider]    HYDROcodone-acetaminophen (NORCO/VICODIN) 5-325 MG tablet Take 1 tablet by mouth every 6 (six) hours as needed for moderate pain. 12/25/19   Bethann Berkshire, MD  ondansetron (ZOFRAN ODT) 4 MG disintegrating tablet 4mg  ODT q4 hours prn nausea/vomit 12/25/19   02/24/20, MD    Allergies    Patient has no known allergies.  Review of Systems   Review of Systems  Constitutional: Negative for appetite change and fatigue.  HENT: Negative for congestion, ear discharge and sinus pressure.   Eyes: Negative for discharge.  Respiratory: Negative for cough.   Cardiovascular: Negative for chest pain.  Gastrointestinal: Positive for abdominal pain. Negative for anorexia and diarrhea.  Genitourinary: Negative for frequency and hematuria.  Musculoskeletal: Negative for back pain.  Skin: Negative for rash.  Neurological: Negative for seizures and headaches.  Psychiatric/Behavioral: Negative for hallucinations.    Physical Exam Updated Vital Signs BP (!) 145/102 (BP Location: Right Arm)   Pulse 92   Temp 98.3 F (36.8 C) (Oral)   Resp 16   LMP 12/15/2019 (Approximate)   SpO2 98%   Physical Exam Vitals reviewed.  Constitutional:      Appearance: She is well-developed.  HENT:  Head: Normocephalic.     Nose: Nose normal.  Eyes:     General: No scleral icterus.    Conjunctiva/sclera: Conjunctivae normal.  Neck:     Thyroid: No thyromegaly.  Cardiovascular:     Rate and Rhythm: Normal rate and regular rhythm.     Heart sounds: No murmur. No friction rub. No gallop.   Pulmonary:     Breath sounds: No stridor. No wheezing or rales.  Chest:     Chest wall: No tenderness.  Abdominal:     General: There is no distension.     Tenderness: There is abdominal tenderness. There is no rebound.  Musculoskeletal:        General: Normal range of motion.     Cervical back: Neck supple.  Lymphadenopathy:     Cervical: No cervical adenopathy.  Skin:    Findings: No erythema or rash.   Neurological:     Mental Status: She is alert and oriented to person, place, and time.     Motor: No abnormal muscle tone.     Coordination: Coordination normal.  Psychiatric:        Behavior: Behavior normal.     ED Results / Procedures / Treatments   Labs (all labs ordered are listed, but only abnormal results are displayed) Labs Reviewed  CBC - Abnormal; Notable for the following components:      Result Value   WBC 11.5 (*)    All other components within normal limits  LIPASE, BLOOD  COMPREHENSIVE METABOLIC PANEL    EKG None  Radiology CT Angio Chest PE W and/or Wo Contrast  Result Date: 12/24/2019 CLINICAL DATA:  Fever and chills EXAM: CT ANGIOGRAPHY CHEST WITH CONTRAST TECHNIQUE: Multidetector CT imaging of the chest was performed using the standard protocol during bolus administration of intravenous contrast. Multiplanar CT image reconstructions and MIPs were obtained to evaluate the vascular anatomy. CONTRAST:  105mL OMNIPAQUE IOHEXOL 350 MG/ML SOLN COMPARISON:  None. FINDINGS: Cardiovascular: There is a optimal opacification of the pulmonary arteries. There is no central,segmental, or subsegmental filling defects within the pulmonary arteries. The heart is normal in size. No pericardial effusion or thickening. No evidence right heart strain. There is normal three-vessel brachiocephalic anatomy without proximal stenosis. The thoracic aorta is normal in appearance. Mediastinum/Nodes: No hilar, mediastinal, or axillary adenopathy. Thyroid gland, trachea, and esophagus demonstrate no significant findings. Lungs/Pleura: The lungs are clear. No pleural effusion or pneumothorax. No airspace consolidation. Upper Abdomen: There appears to be a striated appearance to the left upper pole renal parenchyma with surrounding perinephric stranding. There is a punctate calcifications seen within the left upper pole. A contracted gallbladder seen with gallstones. Musculoskeletal: No chest wall  abnormality. Degenerative changes are seen in the lower thoracic spine with endplate irregularity/Schmorl's nodes. Review of the MIP images confirms the above findings. IMPRESSION: 1. No central, segmental, or subsegmental pulmonary embolism. 2. No acute intrathoracic pathology to explain the patient's symptoms. 3. Findings which could be suggestive of left-sided  pyelonephritis. 4. Cholelithiasis Electronically Signed   By: Jonna Clark M.D.   On: 12/24/2019 01:31   US Abdomen Limited  Result Date: 12/23/2019 CLINICAL DATA:  Fever chills EXAM: ULTRASOUND ABDOMEN LIMITED RIGHT UPPER QUADRANT COMPARISON:  12/21/2019 FINDINGS: Gallbladder: Contracted gallbladder with sludge and multiple stones. Gallbladder wall within normal limits. No sonographic Eulah Pont indicated. Common bile duct: Diameter: 2 mm Liver: Not evaluated portal vein is patent on color Doppler imaging with normal direction of blood flow towards the liver. Other: None.  IMPRESSION: Contracted gallbladder with sludge and stones but no sonographic features to suggest acute cholecystitis. Negative for biliary dilatation Electronically Signed   By: Donavan Foil M.D.   On: 12/23/2019 22:39   DG Chest Port 1 View  Result Date: 12/23/2019 CLINICAL DATA:  Fevers EXAM: PORTABLE CHEST 1 VIEW COMPARISON:  None. FINDINGS: The heart size and mediastinal contours are within normal limits. Both lungs are clear. The visualized skeletal structures are unremarkable. IMPRESSION: No active disease. Electronically Signed   By: Inez Catalina M.D.   On: 12/23/2019 21:38    Procedures Procedures (including critical care time)  Medications Ordered in ED Medications  oxyCODONE-acetaminophen (PERCOCET/ROXICET) 5-325 MG per tablet 1 tablet (1 tablet Oral Given 12/25/19 1558)  ondansetron (ZOFRAN-ODT) disintegrating tablet 4 mg (4 mg Oral Given 12/25/19 1558)    ED Course  I have reviewed the triage vital signs and the nursing notes.  Pertinent labs & imaging results  that were available during my care of the patient were reviewed by me and considered in my medical decision making (see chart for details).    MDM Rules/Calculators/A&P                      Labs unremarkable.  Patient had Percocet that improved her pain.  Patient will follow up with general surgery tomorrow Final Clinical Impression(s) / ED Diagnoses Final diagnoses:  Pain of upper abdomen    Rx / DC Orders ED Discharge Orders         Ordered    HYDROcodone-acetaminophen (NORCO/VICODIN) 5-325 MG tablet  Every 6 hours PRN     12/25/19 1706    ondansetron (ZOFRAN ODT) 4 MG disintegrating tablet     12/25/19 1706           Milton Ferguson, MD 12/25/19 1713

## 2019-12-25 NOTE — ED Triage Notes (Signed)
Pt presents with c/o abdominal pain. Pt reports that she was diagnosed with gallstones on Sunday. Pt then reports that 2 days ago she developed fever and chills, was seen at [facility] for that. Pt reports the fever and chills have resolved but she is now having abdominal pain, denies vomiting. Pt does report that she has had high blood pressure since she delivered her daughter in November. Reports she was given BP meds after having her daughter but has since been cleared from that. She reports that her BP has been high when she has been seen since coming off of her meds.

## 2019-12-25 NOTE — Discharge Instructions (Addendum)
Keep your appointment tomorrow with the surgeon

## 2019-12-26 ENCOUNTER — Ambulatory Visit: Payer: Self-pay | Admitting: Surgery

## 2019-12-26 DIAGNOSIS — K8010 Calculus of gallbladder with chronic cholecystitis without obstruction: Secondary | ICD-10-CM | POA: Diagnosis not present

## 2019-12-26 NOTE — H&P (Signed)
History of Present Illness Jacqueline Peck. Jacqueline Lovins MD; 12/26/2019 1:32 PM) The patient is a 24 year old female who presents for evaluation of gall stones. Referred by Dr. Read Drivers for chronic cholecystitis  This is a 24 year old female in good health who is postpartum since November 2020. Since that time, she has had intermittent postprandial right upper quadrant abdominal pain with occasional radiation to her back. She denies any nausea, vomiting, or diarrhea. She has had several visits to the emergency department. She had ultrasounds that showed cholelithiasis with no sign of acute cholecystitis. Liver function tests were normal. She is now referred to our office for evaluation for cholecystectomy. Currently she is asymptomatic. She has been limiting her diet.  CLINICAL DATA: Fever chills  EXAM: ULTRASOUND ABDOMEN LIMITED RIGHT UPPER QUADRANT  COMPARISON: 12/21/2019  FINDINGS: Gallbladder:  Contracted gallbladder with sludge and multiple stones. Gallbladder wall within normal limits. No sonographic Eulah Pont indicated.  Common bile duct:  Diameter: 2 mm  Liver:  Not evaluated portal vein is patent on color Doppler imaging with normal direction of blood flow towards the liver.  Other: None.  IMPRESSION: Contracted gallbladder with sludge and stones but no sonographic features to suggest acute cholecystitis. Negative for biliary dilatation   Electronically Signed By: Jasmine Pang M.D. On: 12/23/2019 22:39   Past Surgical History (Jacqueline Peck, CMA; 12/26/2019 10:10 AM) Nephrectomy Right.  Diagnostic Studies History (Jacqueline Peck, CMA; 12/26/2019 10:10 AM) Colonoscopy never Mammogram never Pap Smear 1-5 years ago  Allergies (Jacqueline Peck, CMA; 12/26/2019 10:12 AM) No Known Allergies [12/26/2019]: No Known Drug Allergies [12/26/2019]: Allergies Reconciled  Medication History (Jacqueline Peck, CMA; 12/26/2019 10:12 AM) HYDROcodone-Acetaminophen (Oral) Specific  strength unknown - Active. Medications Reconciled  Social History Jacqueline Peck, CMA; 12/26/2019 10:10 AM) Alcohol use Occasional alcohol use. Caffeine use Carbonated beverages, Tea. No drug use Tobacco use Never smoker.  Family History (Jacqueline Peck, CMA; 12/26/2019 10:10 AM) Alcohol Abuse Brother. Arthritis Family Members In General. Cancer Family Members In General. Hypertension Family Members In General.  Pregnancy / Birth History Jacqueline Peck, CMA; 12/26/2019 10:10 AM) Age at menarche 13 years. Gravida 1 Irregular periods Length (months) of breastfeeding 3-6 Maternal age 58-25 Para 1  Other Problems (Jacqueline Peck, CMA; 12/26/2019 10:10 AM) Anxiety Disorder Cholelithiasis Gastroesophageal Reflux Disease Hemorrhoids High blood pressure     Review of Systems (Jacqueline Nolan CMA; 12/26/2019 10:10 AM) General Not Present- Appetite Loss, Chills, Fatigue, Fever, Night Sweats, Weight Gain and Weight Loss. Skin Not Present- Change in Wart/Mole, Dryness, Hives, Jaundice, New Lesions, Non-Healing Wounds, Rash and Ulcer. HEENT Not Present- Earache, Hearing Loss, Hoarseness, Nose Bleed, Oral Ulcers, Ringing in the Ears, Seasonal Allergies, Sinus Pain, Sore Throat, Visual Disturbances, Wears glasses/contact lenses and Yellow Eyes. Respiratory Present- Snoring. Not Present- Bloody sputum, Chronic Cough, Difficulty Breathing and Wheezing. Breast Not Present- Breast Mass, Breast Pain, Nipple Discharge and Skin Changes. Cardiovascular Not Present- Chest Pain, Difficulty Breathing Lying Down, Leg Cramps, Palpitations, Rapid Heart Rate, Shortness of Breath and Swelling of Extremities. Gastrointestinal Present- Abdominal Pain and Excessive gas. Not Present- Bloating, Bloody Stool, Change in Bowel Habits, Chronic diarrhea, Constipation, Difficulty Swallowing, Gets full quickly at meals, Hemorrhoids, Indigestion, Nausea, Rectal Pain and Vomiting. Female Genitourinary Not Present-  Frequency, Nocturia, Painful Urination, Pelvic Pain and Urgency. Musculoskeletal Not Present- Back Pain, Joint Pain, Joint Stiffness, Muscle Pain, Muscle Weakness and Swelling of Extremities. Neurological Not Present- Decreased Memory, Fainting, Headaches, Numbness, Seizures, Tingling, Tremor, Trouble walking and Weakness. Endocrine Not Present- Cold Intolerance, Excessive Hunger, Hair Changes,  Heat Intolerance, Hot flashes and New Diabetes. Hematology Not Present- Blood Thinners, Easy Bruising, Excessive bleeding, Gland problems, HIV and Persistent Infections.  Vitals (Jacqueline Nolan CMA; 12/26/2019 10:13 AM) 12/26/2019 10:12 AM Weight: 189.5 lb Height: 63in Body Surface Area: 1.89 m Body Mass Index: 33.57 kg/m  Temp.: 98.8F  Pulse: 119 (Regular)  BP: 116/80 (Sitting, Left Arm, Standard)        Physical Exam Jacqueline Key K. Ryden Wainer MD; 12/26/2019 1:32 PM)  The physical exam findings are as follows: Note:Constitutional: WDWN in NAD, conversant, no obvious deformities; lying in bed comfortably Eyes: Pupils equal, round; sclera anicteric; moist conjunctiva; no lid lag HENT: Oral mucosa moist; good dentition Neck: No masses palpated, trachea midline; no thyromegaly Lungs: CTA bilaterally; normal respiratory effort CV: Regular rate and rhythm; no murmurs; extremities well-perfused with no edema Abd: +bowel sounds, soft, minimally tender in RUQ, no palpable organomegaly; no palpable hernias Musc: Unable to assess gait; no apparent clubbing or cyanosis in extremities Lymphatic: No palpable cervical or axillary lymphadenopathy Skin: Warm, dry; no sign of jaundice Psychiatric - alert and oriented x 4; calm mood and affect    Assessment & Plan Jacqueline Key K. 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 The surgical procedure has been discussed with the  patient. Potential risks, benefits, alternative treatments, and expected outcomes have been explained. All of the patient's questions at this time have been answered. The likelihood of reaching the patient's treatment goal is good. The patient understand the proposed surgical procedure and wishes to proceed.  Imogene Burn. Georgette Dover, MD, [facility]  General/ Trauma Surgery   12/26/2019 1:32 PM

## 2019-12-29 LAB — CULTURE, BLOOD (ROUTINE X 2)
Culture: NO GROWTH
Culture: NO GROWTH
Special Requests: ADEQUATE
Special Requests: ADEQUATE

## 2020-01-14 ENCOUNTER — Ambulatory Visit (HOSPITAL_COMMUNITY)
Admission: EM | Admit: 2020-01-14 | Discharge: 2020-01-14 | Disposition: A | Discharge status: Home / Self Care | Payer: Medicaid Other | Attending: Emergency Medicine | Admitting: Emergency Medicine

## 2020-01-14 ENCOUNTER — Encounter (HOSPITAL_COMMUNITY): Payer: Self-pay

## 2020-01-14 ENCOUNTER — Other Ambulatory Visit: Payer: Self-pay

## 2020-01-14 DIAGNOSIS — N390 Urinary tract infection, site not specified: Secondary | ICD-10-CM | POA: Insufficient documentation

## 2020-01-14 DIAGNOSIS — Z3202 Encounter for pregnancy test, result negative: Secondary | ICD-10-CM | POA: Diagnosis not present

## 2020-01-14 LAB — POCT URINALYSIS DIP (DEVICE)
Glucose, UA: NEGATIVE mg/dL
Ketones, ur: 15 mg/dL — AB
Nitrite: POSITIVE — AB
Protein, ur: 100 mg/dL — AB
Specific Gravity, Urine: 1.02 (ref 1.005–1.030)
Urobilinogen, UA: 4 mg/dL — ABNORMAL HIGH (ref 0.0–1.0)
pH: 7 (ref 5.0–8.0)

## 2020-01-14 LAB — POCT PREGNANCY, URINE: Preg Test, Ur: NEGATIVE

## 2020-01-14 LAB — POC URINE PREG, ED
Preg Test, Ur: NEGATIVE
Preg Test, Ur: NEGATIVE

## 2020-01-14 MED ORDER — NITROFURANTOIN MONOHYD MACRO 100 MG PO CAPS: 100.0000 mg | ORAL_CAPSULE | Freq: Two times a day (BID) | ORAL | 0 refills | Status: AC

## 2020-01-14 NOTE — Discharge Instructions (Signed)
Urine showed evidence of infection. We are treating you with macrobid- twice daily for 5 days. Be sure to take full course. Stay hydrated- urine should be pale yellow to clear.  °Please return or follow up with your primary provider if symptoms not improving with treatment. Please return sooner if you have worsening of symptoms or develop fever, nausea, vomiting, abdominal pain, back pain, lightheadedness, dizziness. °

## 2020-01-14 NOTE — ED Triage Notes (Signed)
Pt presents with concerns with dark urine X 2 days.

## 2020-01-14 NOTE — ED Provider Notes (Signed)
[facility]    CSN: [id number] Arrival date & time: 01/14/20  6967      History   Chief Complaint Chief Complaint  Patient presents with  . Discolored Urine    HPI Jacqueline Peck is a 24 y.o. female history of prior nephrectomy, presenting today for evaluation of possible UTI.  Patient notes that over the past couple days she has noticed that her urine has been darker than normal.  She denies any other associated urinary symptoms of dysuria, urgency, frequency or incomplete voiding.  Denies hematuria.  She notes that she has plans to have cholecystectomy in approximately 1 month.  Had a recent gallbladder attack approximately 2 days ago and was prescribed hydrocodone.  She was unsure if this may have caused the symptoms.  Reports a history of prior UTIs when younger.  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  Denies vaginal discharge or vaginal symptoms.  HPI  History reviewed. No pertinent past medical history.  Patient Active Problem List   Diagnosis Date Noted  . Pregnancy 09/16/2019  . Polyhydramnios 09/15/2019    Past Surgical History:  Procedure Laterality Date  . NEPHRECTOMY Right     OB History    Gravida  1   Para  1   Term  1   Preterm      AB      Living  1     SAB      TAB      Ectopic      Multiple  0   Live Births  1            Home Medications    Prior to Admission medications   Medication Sig Start Date End Date Taking? Authorizing Provider  acetaminophen (TYLENOL) 500 MG tablet Take 500 mg by mouth every 6 (six) hours as needed for mild pain.    [provider]  HYDROcodone-acetaminophen (NORCO/VICODIN) 5-325 MG tablet Take 1 tablet by mouth every 6 (six) hours as needed for moderate pain. 12/25/19   Bethann Berkshire, MD  ibuprofen (ADVIL) 200 MG tablet Take 200 mg by mouth every 6 (six) hours as needed for fever or headache.    [provider]  nitrofurantoin, macrocrystal-monohydrate, (MACROBID) 100 MG capsule Take  1 capsule (100 mg total) by mouth 2 (two) times daily for 5 days. 01/14/20 01/19/20  Lew Prout C, PA-C  ondansetron (ZOFRAN ODT) 4 MG disintegrating tablet 4mg  ODT q4 hours prn nausea/vomit 12/25/19   02/24/20, MD    Family History Family History  Family history unknown: Yes    Social History Social History   Tobacco Use  . Smoking status: Never Smoker  . Smokeless tobacco: Never Used  Substance Use Topics  . Alcohol use: Not Currently  . Drug use: Never     Allergies   Patient has no known allergies.   Review of Systems Review of Systems  Constitutional: Negative for fever.  Respiratory: Negative for shortness of breath.   Cardiovascular: Negative for chest pain.  Gastrointestinal: Negative for abdominal pain, diarrhea, nausea and vomiting.  Genitourinary: Negative for dysuria, flank pain, genital sores, hematuria, menstrual problem, vaginal bleeding, vaginal discharge and vaginal pain.  Musculoskeletal: Negative for back pain.  Skin: Negative for rash.  Neurological: Negative for dizziness, light-headedness and headaches.     Physical Exam Triage Vital Signs ED Triage Vitals  Enc Vitals Group     BP 01/14/20 1905 (!) 148/107     Pulse Rate 01/14/20 1905 87  Resp 01/14/20 1905 18     Temp 01/14/20 1905 98.7 F (37.1 C)     Temp Source 01/14/20 1905 Oral     SpO2 01/14/20 1905 100 %     Weight --      Height --      Head Circumference --      Peak Flow --      Pain Score 01/14/20 1904 4     Pain Loc --      Pain Edu? --      Excl. in [place]? --    No data found.  Updated Vital Signs BP (!) 148/107 (BP Location: Right Arm)   Pulse 87   Temp 98.7 F (37.1 C) (Oral)   Resp 18   LMP 12/15/2019 (Approximate)   SpO2 100%   Visual Acuity Right Eye Distance:   Left Eye Distance:   Bilateral Distance:    Right Eye Near:   Left Eye Near:    Bilateral Near:     Physical Exam Vitals and nursing note reviewed.  Constitutional:       Appearance: She is well-developed.     Comments: No acute distress  HENT:     Head: Normocephalic and atraumatic.     Nose: Nose normal.  Eyes:     Conjunctiva/sclera: Conjunctivae normal.  Cardiovascular:     Rate and Rhythm: Normal rate.  Pulmonary:     Effort: Pulmonary effort is normal. No respiratory distress.  Abdominal:     General: There is no distension.     Comments: Soft, nondistended, nontender to light and deep palpation throughout abdomen  Musculoskeletal:        General: Normal range of motion.     Cervical back: Neck supple.  Skin:    General: Skin is warm and dry.  Neurological:     Mental Status: She is alert and oriented to person, place, and time.      UC Treatments / Results  Labs (all labs ordered are listed, but only abnormal results are displayed) Labs Reviewed  POCT URINALYSIS DIP (DEVICE) - Abnormal; Notable for the following components:      Result Value   Bilirubin Urine LARGE (*)    Ketones, ur 15 (*)    Hgb urine dipstick TRACE (*)    Protein, ur 100 (*)    Urobilinogen, UA 4.0 (*)    Nitrite POSITIVE (*)    Leukocytes,Ua SMALL (*)    All other components within normal limits  URINE CULTURE  POC URINE PREG, ED  POCT PREGNANCY, URINE  POC URINE PREG, ED    EKG   Radiology No results found.  Procedures Procedures (including critical care time)  Medications Ordered in UC Medications - No data to display  Initial Impression / Assessment and Plan / UC Course  I have reviewed the triage vital signs and the nursing notes.  Pertinent labs & imaging results that were available during my care of the patient were reviewed by me and considered in my medical decision making (see chart for details).     UA with positive nitrites, small leuks, consistent with UTI.  Sending for culture to confirm as well as check sensitivities.  Empirically started on Macrobid twice daily x5 days today.  Will call if needing to alter therapy.  Push fluids.   Follow-up for urine recheck if dark color persisting despite treatment for UTI.  Discussed strict return precautions. Patient verbalized understanding and is agreeable with plan.  Final Clinical  Impressions(s) / UC Diagnoses   Final diagnoses:  Lower urinary tract infection     Discharge Instructions     Urine showed evidence of infection. We are treating you with macrobid- twice daily for 5 days. Be sure to take full course. Stay hydrated- urine should be pale yellow to clear.   Please return or follow up with your primary provider if symptoms not improving with treatment. Please return sooner if you have worsening of symptoms or develop fever, nausea, vomiting, abdominal pain, back pain, lightheadedness, dizziness.     ED Prescriptions    Medication Sig Dispense Auth. Provider   nitrofurantoin, macrocrystal-monohydrate, (MACROBID) 100 MG capsule Take 1 capsule (100 mg total) by mouth 2 (two) times daily for 5 days. 10 capsule Agustine Rossitto, [place] C, PA-C     PDMP not reviewed this encounter.   Lew Dawes, [place] 01/14/20 1951

## 2020-01-16 LAB — URINE CULTURE: Culture: >=100000 — AB

## 2020-02-13 DIAGNOSIS — Z01818 Encounter for other preprocedural examination: Secondary | ICD-10-CM | POA: Diagnosis not present

## 2020-02-17 DIAGNOSIS — K8012 Calculus of gallbladder with acute and chronic cholecystitis without obstruction: Secondary | ICD-10-CM | POA: Diagnosis not present

## 2020-02-17 DIAGNOSIS — K811 Chronic cholecystitis: Secondary | ICD-10-CM | POA: Diagnosis not present

## 2020-02-17 DIAGNOSIS — K812 Acute cholecystitis with chronic cholecystitis: Secondary | ICD-10-CM | POA: Diagnosis not present

## 2020-03-03 ENCOUNTER — Emergency Department (HOSPITAL_COMMUNITY): Payer: Medicaid Other

## 2020-03-03 ENCOUNTER — Other Ambulatory Visit: Payer: Self-pay

## 2020-03-03 ENCOUNTER — Emergency Department (HOSPITAL_COMMUNITY)
Admission: EM | Admit: 2020-03-03 | Discharge: 2020-03-03 | Disposition: A | Discharge status: Home / Self Care | Payer: Medicaid Other | Attending: Emergency Medicine | Admitting: Emergency Medicine

## 2020-03-03 ENCOUNTER — Encounter (HOSPITAL_COMMUNITY): Payer: Self-pay

## 2020-03-03 DIAGNOSIS — R109 Unspecified abdominal pain: Secondary | ICD-10-CM | POA: Diagnosis present

## 2020-03-03 DIAGNOSIS — Z79899 Other long term (current) drug therapy: Secondary | ICD-10-CM | POA: Insufficient documentation

## 2020-03-03 DIAGNOSIS — R1013 Epigastric pain: Secondary | ICD-10-CM | POA: Insufficient documentation

## 2020-03-03 DIAGNOSIS — N3000 Acute cystitis without hematuria: Secondary | ICD-10-CM | POA: Insufficient documentation

## 2020-03-03 DIAGNOSIS — R1011 Right upper quadrant pain: Secondary | ICD-10-CM | POA: Diagnosis not present

## 2020-03-03 LAB — CBC WITH DIFFERENTIAL/PLATELET
Abs Immature Granulocytes: 0.02 10*3/uL (ref 0.00–0.07)
Basophils Absolute: 0 10*3/uL (ref 0.0–0.1)
Basophils Relative: 0 %
Eosinophils Absolute: 0.2 10*3/uL (ref 0.0–0.5)
Eosinophils Relative: 3 %
HCT: 43.5 % (ref 36.0–46.0)
Hemoglobin: 13.6 g/dL (ref 12.0–15.0)
Immature Granulocytes: 0 %
Lymphocytes Relative: 27 %
Lymphs Abs: 1.7 10*3/uL (ref 0.7–4.0)
MCH: 26.2 pg (ref 26.0–34.0)
MCHC: 31.3 g/dL (ref 30.0–36.0)
MCV: 83.8 fL (ref 80.0–100.0)
Monocytes Absolute: 0.4 10*3/uL (ref 0.1–1.0)
Monocytes Relative: 6 %
Neutro Abs: 4.2 10*3/uL (ref 1.7–7.7)
Neutrophils Relative %: 64 %
Platelets: 277 10*3/uL (ref 150–400)
RBC: 5.19 MIL/uL — ABNORMAL HIGH (ref 3.87–5.11)
RDW: 14.3 % (ref 11.5–15.5)
WBC: 6.5 10*3/uL (ref 4.0–10.5)
nRBC: 0 % (ref 0.0–0.2)

## 2020-03-03 LAB — URINALYSIS, ROUTINE W REFLEX MICROSCOPIC
Bilirubin Urine: NEGATIVE
Glucose, UA: NEGATIVE mg/dL
Hgb urine dipstick: NEGATIVE
Ketones, ur: NEGATIVE mg/dL
Nitrite: NEGATIVE
Protein, ur: 30 mg/dL — AB
Specific Gravity, Urine: 1.024 (ref 1.005–1.030)
WBC, UA: >50 WBC/hpf — ABNORMAL HIGH (ref 0–5)
pH: 5 (ref 5.0–8.0)

## 2020-03-03 LAB — COMPREHENSIVE METABOLIC PANEL
ALT: 39 U/L (ref 0–44)
AST: 82 U/L — ABNORMAL HIGH (ref 15–41)
Albumin: 3.9 g/dL (ref 3.5–5.0)
Alkaline Phosphatase: 128 U/L — ABNORMAL HIGH (ref 38–126)
Anion gap: 9 (ref 5–15)
BUN: 9 mg/dL (ref 6–20)
CO2: 23 mmol/L (ref 22–32)
Calcium: 9.1 mg/dL (ref 8.9–10.3)
Chloride: 107 mmol/L (ref 98–111)
Creatinine, Ser: 0.88 mg/dL (ref 0.44–1.00)
GFR calc Af Amer: >60 mL/min (ref >60)
GFR calc non Af Amer: >60 mL/min (ref >60)
Glucose, Bld: 109 mg/dL — ABNORMAL HIGH (ref 70–99)
Potassium: 4.4 mmol/L (ref 3.5–5.1)
Sodium: 139 mmol/L (ref 135–145)
Total Bilirubin: 0.9 mg/dL (ref 0.3–1.2)
Total Protein: 7.1 g/dL (ref 6.5–8.1)

## 2020-03-03 LAB — I-STAT BETA HCG BLOOD, ED (MC, WL, AP ONLY): I-stat hCG, quantitative: <5 m[IU]/mL (ref <5)

## 2020-03-03 LAB — LIPASE, BLOOD: Lipase: 38 U/L (ref 11–51)

## 2020-03-03 MED ORDER — ONDANSETRON HCL 4 MG/2ML IJ SOLN
4.0000 mg | Freq: Once | INTRAMUSCULAR | Status: AC
  Administered 2020-03-03: 4 mg via INTRAVENOUS
  Filled 2020-03-03: qty 2

## 2020-03-03 MED ORDER — SODIUM CHLORIDE 0.9 % IV SOLN
1.0000 g | Freq: Once | INTRAVENOUS | Status: AC
  Administered 2020-03-03: 1 g via INTRAVENOUS
  Filled 2020-03-03: qty 10

## 2020-03-03 MED ORDER — ACETAMINOPHEN 325 MG PO TABS
650.0000 mg | ORAL_TABLET | Freq: Once | ORAL | Status: AC
  Administered 2020-03-03: 650 mg via ORAL
  Filled 2020-03-03: qty 2

## 2020-03-03 MED ORDER — SODIUM CHLORIDE 0.9 % IV SOLN: INTRAVENOUS | Status: DC

## 2020-03-03 MED ORDER — IOHEXOL 300 MG/ML  SOLN
100.0000 mL | Freq: Once | INTRAMUSCULAR | Status: AC | PRN
  Administered 2020-03-03: 100 mL via INTRAVENOUS

## 2020-03-03 MED ORDER — FAMOTIDINE 20 MG PO TABS
20.0000 mg | ORAL_TABLET | Freq: Two times a day (BID) | ORAL | 0 refills | Status: DC
Start: 2020-03-03 — End: 2020-07-13

## 2020-03-03 MED ORDER — CEPHALEXIN 500 MG PO CAPS
500.0000 mg | ORAL_CAPSULE | Freq: Four times a day (QID) | ORAL | 0 refills | Status: DC
Start: 2020-03-03 — End: 2020-06-16

## 2020-03-03 MED ORDER — HYDROMORPHONE HCL 1 MG/ML IJ SOLN
0.5000 mg | Freq: Once | INTRAMUSCULAR | Status: DC
  Filled 2020-03-03: qty 1

## 2020-03-03 NOTE — ED Notes (Signed)
Pt requested to wait until her husband arrived before the IV was inserting d/t her being nervous.

## 2020-03-03 NOTE — Discharge Instructions (Signed)
Take the Keflex as directed for the urinary tract infection.  Take the Pepcid for the next 2 weeks.  Reschedule your appointment to follow-up with general surgery.  But the work-up here today shows no problems related to the surgery.  Return for any new or worse symptoms.

## 2020-03-03 NOTE — ED Notes (Signed)
Pt transported to CT ?

## 2020-03-03 NOTE — ED Notes (Signed)
Pt was given a 20g IV in Rt AC, her anticipation was very hype & she was saying it hurt when zofran was pushed through the IV. After several minutes, the IV was flushed to verify patency & the IV does still work & is not blown/infiltrated. 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

## 2020-03-03 NOTE — ED Triage Notes (Addendum)
Pt had gallbladder surgery on 02/17/20 & had some initial abd pain in the following days post-op, she was informed by the surgeons office when she reached out about her pain that it was constipation. As of now, she has upper abd pain that radiates into her back bilaterally & varies from pain rated 8/10 & 3/10. This began this morning & feels like a "sharp/cramp." Pt states that she is passing soft stools & denies n/v.

## 2020-03-03 NOTE — ED Provider Notes (Signed)
[name] EMERGENCY DEPARTMENT Provider Note   CSN: [id number] Arrival date & time: 03/03/20  7106     History Chief Complaint  Patient presents with  . Abdominal Pain  . Post-op Problem    Jacqueline Peck is a 24 y.o. female.  Patient with acute onset of upper quadrant abdominal pain is 6 this morning.  Patient status post cholecystectomy April 27.  No nausea or vomiting.  Patient has had some loose bowel movements.  The pain is predominantly bilateral upper back and radiate around to the front but not made worse by any movements.  But patient points to the pain being mostly in the front.  Denies any dysuria.  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 No pertinent past medical history.  Patient Active Problem List   Diagnosis Date Noted  . Pregnancy 09/16/2019  . Polyhydramnios 09/15/2019    Past Surgical History:  Procedure Laterality Date  . NEPHRECTOMY Right      OB History    Gravida  1   Para  1   Term  1   Preterm      AB      Living  1     SAB      TAB      Ectopic      Multiple  0   Live Births  1           Family History  Family history unknown: Yes    Social History   Tobacco Use  . Smoking status: Never Smoker  . Smokeless tobacco: Never Used  Substance Use Topics  . Alcohol use: Not Currently  . Drug use: Never    Home Medications Prior to Admission medications   Medication Sig Start Date End Date Taking? Authorizing Provider  ibuprofen (ADVIL) 200 MG tablet Take 200 mg by mouth every 6 (six) hours as needed for fever or headache.   Yes [provider]  ondansetron (ZOFRAN ODT) 4 MG disintegrating tablet 4mg  ODT q4 hours prn nausea/vomit Patient taking differently: Take 4 mg by mouth every 4 (four) hours as needed for nausea or vomiting.  12/25/19  Yes Milton Ferguson, MD  cephALEXin (KEFLEX) 500 MG capsule Take 1 capsule (500 mg total) by mouth 4 (four) times daily. 03/03/20    Fredia Sorrow, MD  famotidine (PEPCID) 20 MG tablet Take 1 tablet (20 mg total) by mouth 2 (two) times daily. 03/03/20   Fredia Sorrow, MD  HYDROcodone-acetaminophen (NORCO/VICODIN) 5-325 MG tablet Take 1 tablet by mouth every 6 (six) hours as needed for moderate pain. Patient not taking: Reported on 03/03/2020 12/25/19   Milton Ferguson, MD    Allergies    Patient has no known allergies.  Review of Systems   Review of Systems  Constitutional: Negative for chills and fever.  HENT: Negative for congestion, rhinorrhea and sore throat.   Eyes: Negative for visual disturbance.  Respiratory: Negative for cough and shortness of breath.   Cardiovascular: Negative for chest pain and leg swelling.  Gastrointestinal: Positive for abdominal pain and diarrhea. Negative for nausea and vomiting.  Genitourinary: Negative for dysuria.  Musculoskeletal: Positive for back pain. Negative for neck pain.  Skin: Negative for rash.  Neurological: Negative for dizziness, light-headedness and headaches.  Hematological: Does not bruise/bleed easily.  Psychiatric/Behavioral: Negative for confusion.    Physical Exam Updated Vital Signs BP (!) 136/97   Pulse 68   Temp 98 F (36.7  C) (Oral)   Resp 19   SpO2 99%   Physical Exam Vitals and nursing note reviewed.  Constitutional:      General: She is not in acute distress.    Appearance: She is well-developed.  HENT:     Head: Normocephalic and atraumatic.  Eyes:     Extraocular Movements: Extraocular movements intact.     Conjunctiva/sclera: Conjunctivae normal.     Pupils: Pupils are equal, round, and reactive to light.  Cardiovascular:     Rate and Rhythm: Normal rate and regular rhythm.     Heart sounds: No murmur.  Pulmonary:     Effort: Pulmonary effort is normal. No respiratory distress.     Breath sounds: Normal breath sounds.  Abdominal:     Palpations: Abdomen is soft.     Tenderness: There is no abdominal tenderness.     Comments:  Abdomen nontender cholecystectomy laparoscopic incision sites healing well.  Musculoskeletal:        General: Normal range of motion.     Cervical back: Normal range of motion and neck supple.     Comments: No tenderness to palpation to the back area.  No CVA tenderness.  Skin:    General: Skin is warm and dry.     Capillary Refill: Capillary refill takes less than 2 seconds.  Neurological:     General: No focal deficit present.     Mental Status: She is alert and oriented to person, place, and time.     ED Results / Procedures / Treatments   Labs (all labs ordered are listed, but only abnormal results are displayed) Labs Reviewed  COMPREHENSIVE METABOLIC PANEL - Abnormal; Notable for the following components:      Result Value   Glucose, Bld 109 (*)    AST 82 (*)    Alkaline Phosphatase 128 (*)    All other components within normal limits  CBC WITH DIFFERENTIAL/PLATELET - Abnormal; Notable for the following components:   RBC 5.19 (*)    All other components within normal limits  URINALYSIS, ROUTINE W REFLEX MICROSCOPIC - Abnormal; Notable for the following components:   Protein, ur 30 (*)    Leukocytes,Ua MODERATE (*)    WBC, UA >50 (*)    Bacteria, UA FEW (*)    All other components within normal limits  URINE CULTURE  LIPASE, BLOOD  I-STAT BETA HCG BLOOD, ED ([facility], [facility], [facility] ONLY)    EKG None  Radiology CT Abdomen Pelvis W Contrast  Result Date: 03/03/2020 CLINICAL DATA:  Post cholecystectomy at the end of April with new onset RIGHT upper quadrant pain EXAM: CT ABDOMEN AND PELVIS WITH CONTRAST TECHNIQUE: Multidetector CT imaging of the abdomen and pelvis was performed using the standard protocol following bolus administration of intravenous contrast. CONTRAST:  OMNIPAQUE IOHEXOL 300 MG/ML  SOLN COMPARISON:  Ultrasound of December 23, 2019 FINDINGS: Lower chest: Lung bases are clear. Hepatobiliary: Mild periportal edema. No biliary ductal dilation with changes of  cholecystectomy. Mild stranding in the gallbladder fossa without organized fluid collection Pancreas: Pancreas is normal without focal lesion. Spleen: At spleen is normal size without focal lesion. Adrenals/Urinary Tract: Adrenal glands are normal. 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 No evidence of hydronephrosis. Urinary bladder is normal. Stomach/Bowel: No acute gastrointestinal process. Vascular/Lymphatic: Vascular structures in the abdomen are patent. No sign of adenopathy in the abdomen or in the pelvis. Reproductive: CT appearance of the uterus and adnexa is  unremarkable. Other: Small amount of stranding about the umbilicus may relate to recent cholecystectomy if laparoscopic procedure was performed. No ascites. No focal fluid collection. Musculoskeletal: No acute musculoskeletal process. Spinal degenerative changes with multiple Schmorl's nodes. IMPRESSION: 1. Mild stranding in the gallbladder fossa without organized fluid collection. 2. Mild periportal edema. 3. 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 Correlate with any history of reflux or repeated UTIs. This may be of clinical significance in a patient with single kidney. 4. Small amount of stranding about the umbilicus may relate to recent cholecystectomy if laparoscopic procedure was performed. 5. Normal appendix. Electronically Signed   By: Donzetta Kohut M.D.   On: 03/03/2020 11:21    Procedures Procedures (including critical care time)  Medications Ordered in ED Medications  HYDROmorphone (DILAUDID) injection 0.5 mg (0 mg Intravenous Hold 03/03/20 0933)  0.9 %  sodium chloride infusion ( Intravenous Bolus from Bag 03/03/20 0925)  ondansetron (ZOFRAN) injection 4 mg (4 mg Intravenous Given 03/03/20 0907)  iohexol (OMNIPAQUE) 300 MG/ML solution 100 mL (100 mLs Intravenous Contrast Given 03/03/20 1055)  cefTRIAXone (ROCEPHIN) 1 g in sodium chloride 0.9 % 100 mL IVPB  (0 g Intravenous Stopped 03/03/20 1425)  acetaminophen (TYLENOL) tablet 650 mg (650 mg Oral Given 03/03/20 1434)    ED Course  I have reviewed the triage vital signs and the nursing notes.  Pertinent labs & imaging results that were available during my care of the patient were reviewed by me and considered in my medical decision making (see chart for details).    MDM Rules/Calculators/A&P                      Due to the recent cholecystectomy.  Will get CT scan of the abdomen to evaluate the upper abdomen to rule out any abscess collections.  Will get CBC basic metabolic panel lipase urinalysis.   CT scan without any acute findings.  Urinalysis raise questionable urinary tract infection.  Urine culture sent.  Given a dose of Rocephin here and will treat with Keflex at home for the next 2 weeks.  Patient will reschedule her follow-up with general surgery.  But no evidence of any acute problems related to the surgery.  Since the pain is epigastric we will give a trial of Pepcid for the next 2 weeks to see if it helps.   Final Clinical Impression(s) / ED Diagnoses Final diagnoses:  Acute cystitis without hematuria  Epigastric pain    Rx / DC Orders ED Discharge Orders         Ordered    cephALEXin (KEFLEX) 500 MG capsule  4 times daily     03/03/20 1500    famotidine (PEPCID) 20 MG tablet  2 times daily     03/03/20 1500           Vanetta Mulders, MD 03/03/20 1501

## 2020-03-03 NOTE — ED Notes (Signed)
Attempted to start pt's IV, she was very emotional & this RN was unable to stick d/t pt not being "ready." Another co-worker was approach to assist with emotional support on next approach.

## 2020-03-05 LAB — URINE CULTURE: Culture: >=100000 — AB

## 2020-03-07 ENCOUNTER — Telehealth: Payer: Self-pay | Admitting: Emergency Medicine

## 2020-03-07 NOTE — Telephone Encounter (Signed)
Post ED Visit - Positive Culture Follow-up  Culture report reviewed by antimicrobial stewardship pharmacist: [facility] []  , Pharm.D. []  Enzo Bi, Pharm.D., BCPS AQ-ID []  , Pharm.D., BCPS []  Celedonio Miyamoto, Pharm.D., BCPS []  [place], Garvin Fila.D., BCPS, AAHIVP []  , Pharm.D., BCPS, AAHIVP []  Georgina Pillion, PharmD, BCPS []  , PharmD, BCPS []  [place], PharmD, BCPS []  [address], PharmD []  , PharmD, BCPS [x]  Estella Husk, PharmD  Pharmacy Team []  Lysle Pearl, PharmD []  , PharmD []  Phillips Climes, PharmD []  , Rph []  Agapito Games) , PharmD []  Verlan Friends, PharmD []  , PharmD []  Mervyn Gay, PharmD []  , PharmD []  Vinnie Level, PharmD []  [facility], PharmD []  , PharmD []  Len Childs, PharmD   Positive urine culture Treated with Cephalexin, organism sensitive to the same and no further patient follow-up is required at this time.  Anissa Abbs 03/07/2020, 5:46 PM

## 2020-03-29 IMAGING — US US ABDOMEN LIMITED
1 series · 14 of 25 positions shown · non-contrast
Comparison: None.

CLINICAL DATA: Right upper quadrant pain

EXAM:
ULTRASOUND ABDOMEN LIMITED RIGHT UPPER QUADRANT

[Series 1: us abdomen limited · 14 of 43 slices shown]
[im 1/43]
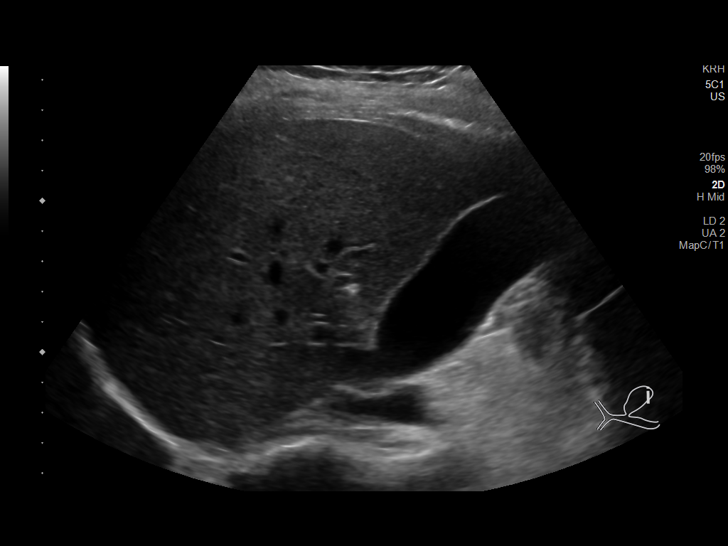
[im 4/43]
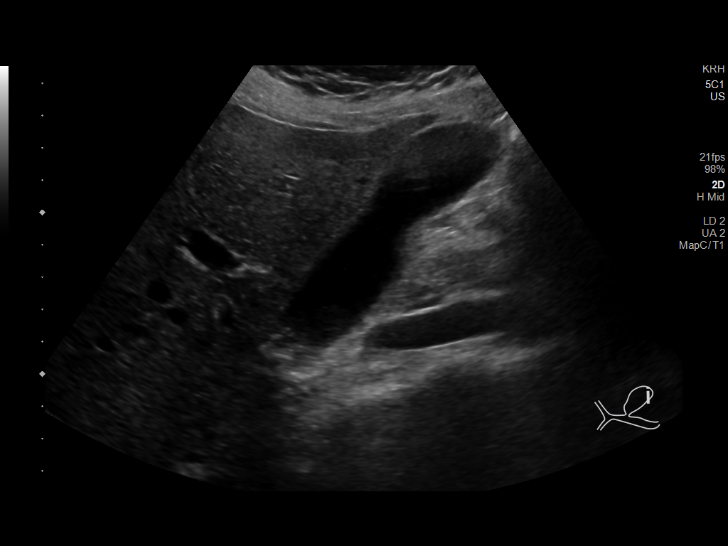
[im 8/43]
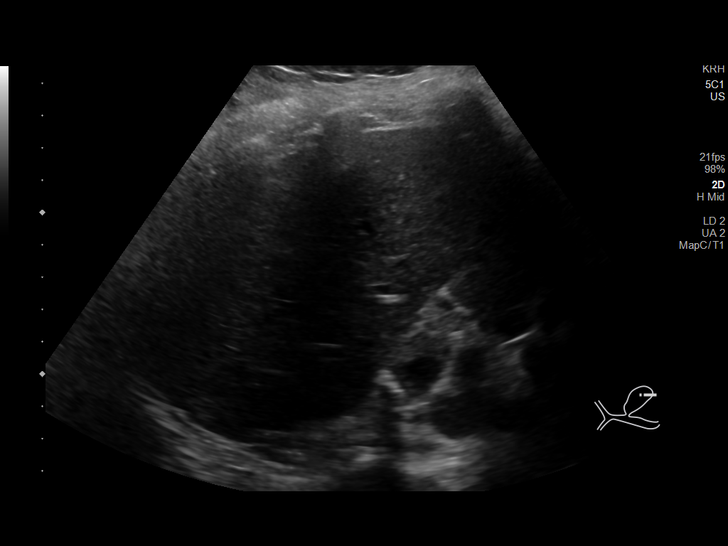
[im 11/43]
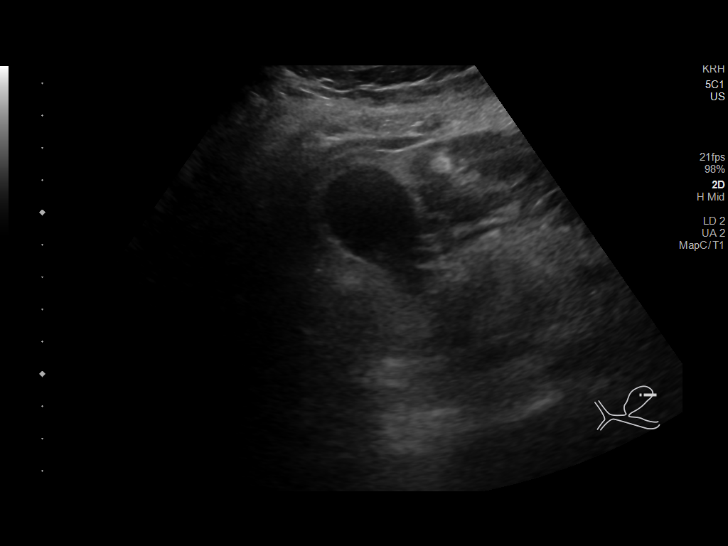
[im 15/43]
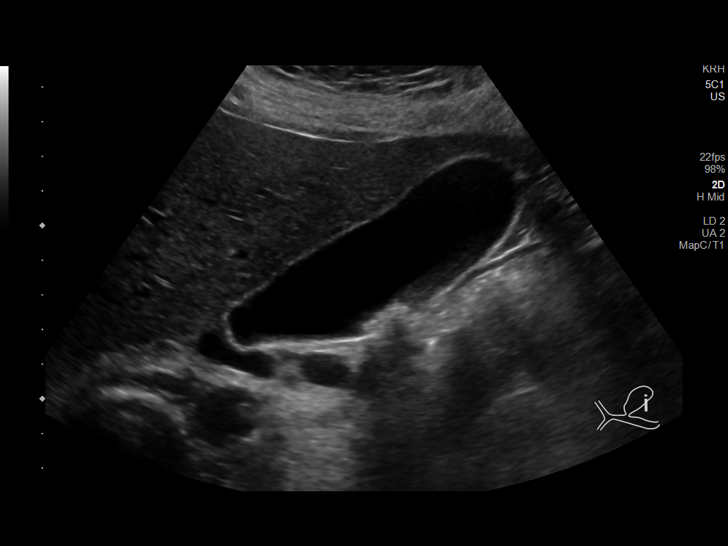
[im 16/43]
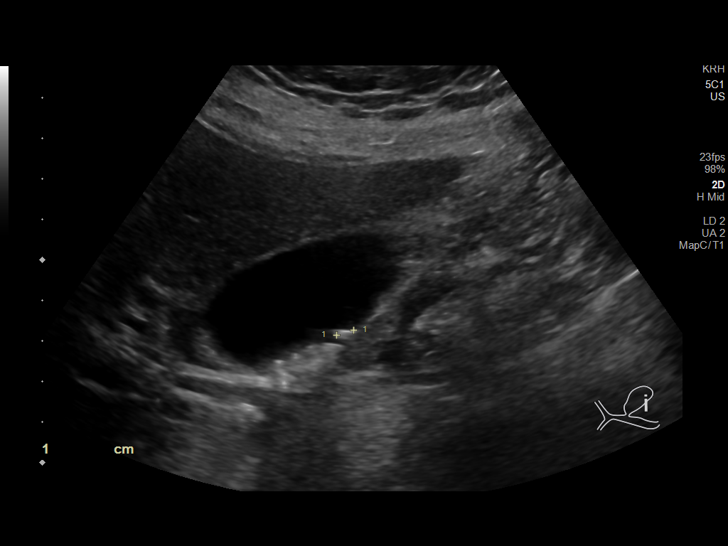
[im 20/43]
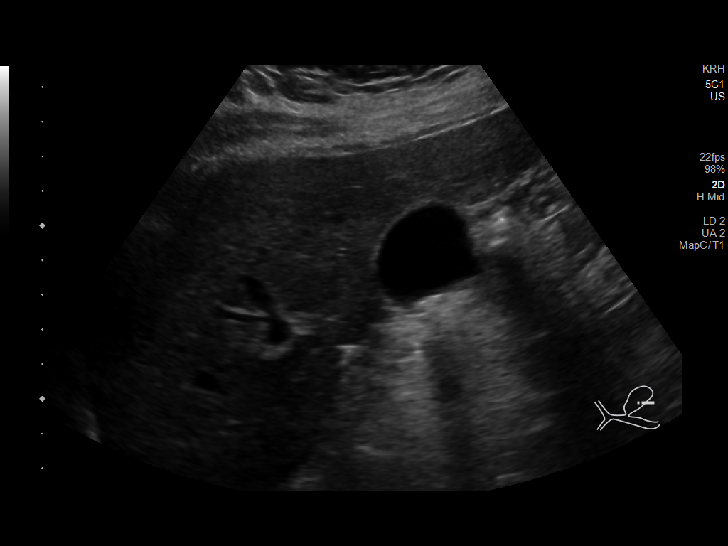
[im 23/43]
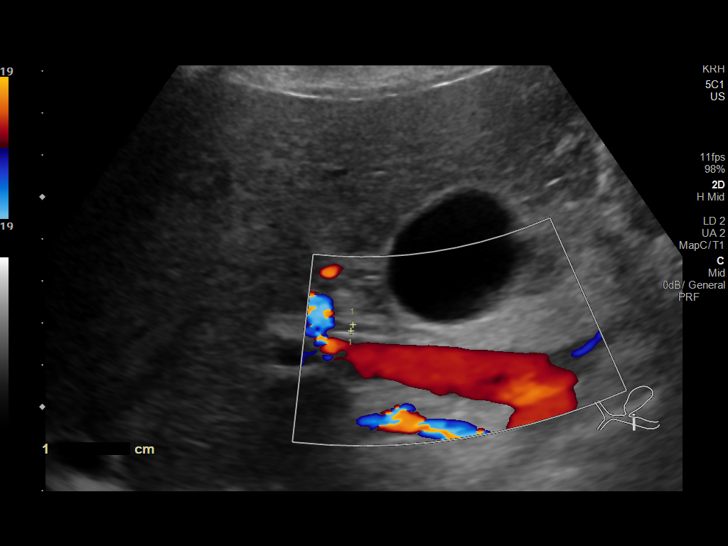
[im 27/43]
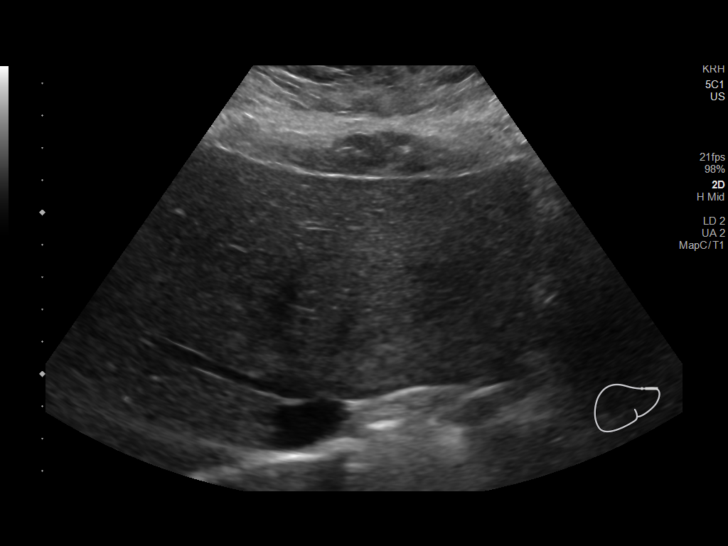
[im 29/43]
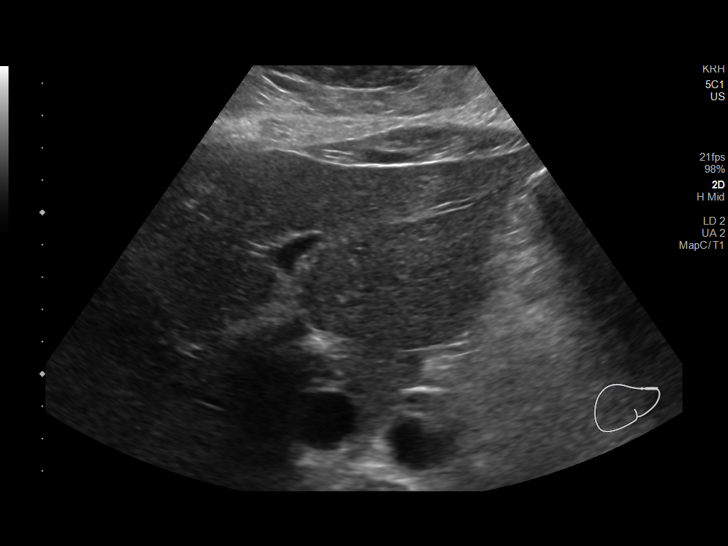
[im 32/43]
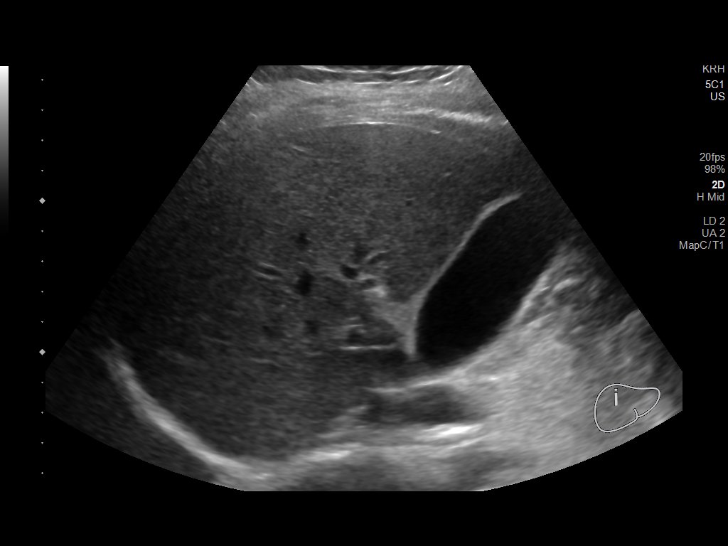
[im 36/43]
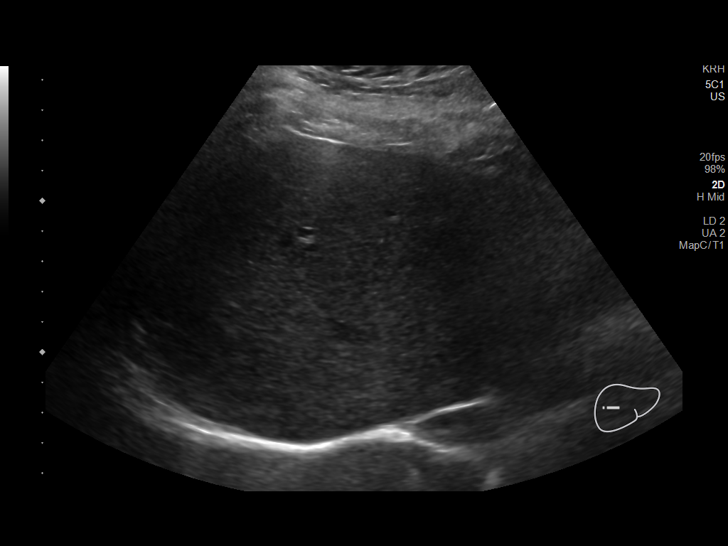
[im 39/43]
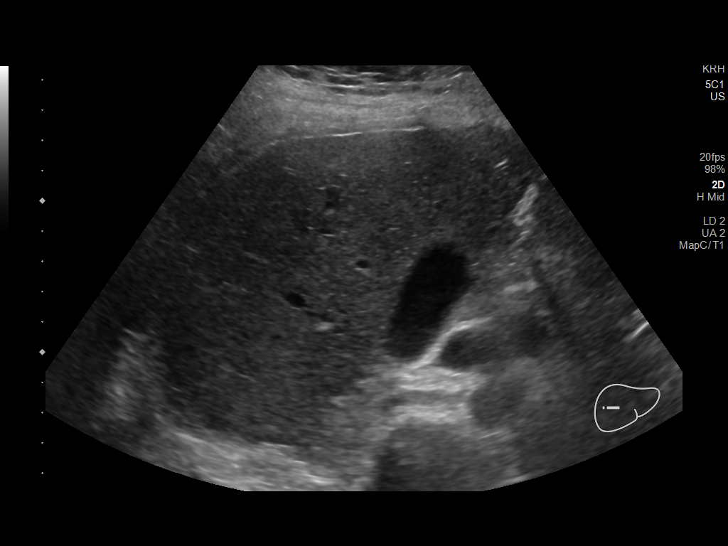
[im 43/43]
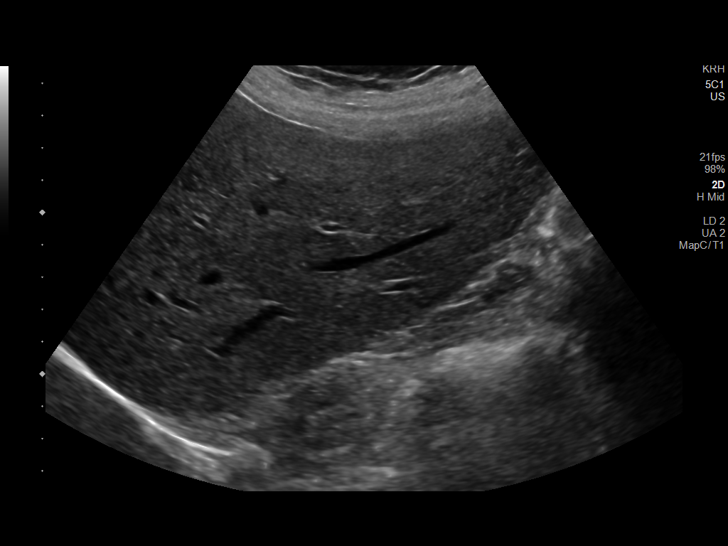

[14 of 25 positions shown; findings below may reference images not displayed]

FINDINGS: Gallbladder:

Cholelithiasis without pericholecystic fluid or gallbladder wall
thickening. Negative sonographic Murphy sign. Largest gallstone
measures 4 mm.

Common bile duct:

Diameter: 2 mm

Liver:

No focal lesion identified. Within normal limits in parenchymal
echogenicity. Portal vein is patent on color Doppler imaging with
normal direction of blood flow towards the liver.

Other: None.
IMPRESSION: Cholelithiasis without sonographic evidence of acute cholecystitis.

## 2020-04-22 DIAGNOSIS — Z419 Encounter for procedure for purposes other than remedying health state, unspecified: Secondary | ICD-10-CM | POA: Diagnosis not present

## 2020-04-27 ENCOUNTER — Encounter (HOSPITAL_COMMUNITY): Payer: Self-pay | Admitting: Emergency Medicine

## 2020-05-23 DIAGNOSIS — Z419 Encounter for procedure for purposes other than remedying health state, unspecified: Secondary | ICD-10-CM | POA: Diagnosis not present

## 2020-06-10 IMAGING — CT CT ABD-PELV W/ CM
2 of 4 series · 16 of 46 positions shown, 18 images · IV contrast (Omni 300)
Comparison: Ultrasound December 23, 2019

CLINICAL DATA: Post cholecystectomy at the end [DATE] with new
onset RIGHT upper quadrant pain

EXAM:
CT ABDOMEN AND PELVIS WITH CONTRAST
TECHNIQUE: Multidetector CT imaging of the abdomen and pelvis was performed
using the standard protocol following bolus administration of
intravenous contrast.
CONTRAST:  100mL OMNIPAQUE IOHEXOL 300 MG/ML  SOLN

[Series 3: a/p w/ 5mm · axial · 0.78mm/px · z∈[+339,+779]mm · 13 of 98 slices shown, 15 images]
[im 5/98  soft-tissue]
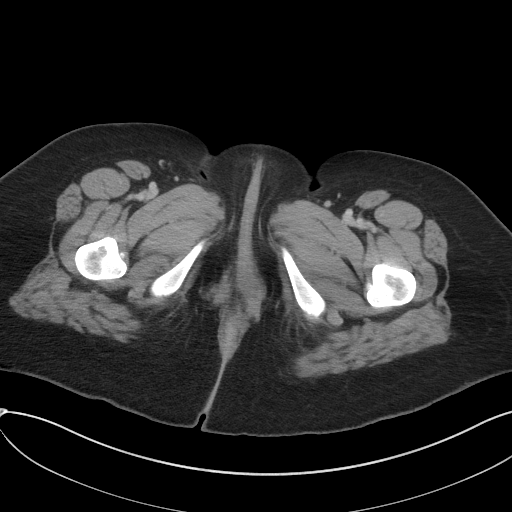
[im 5/98  bone]
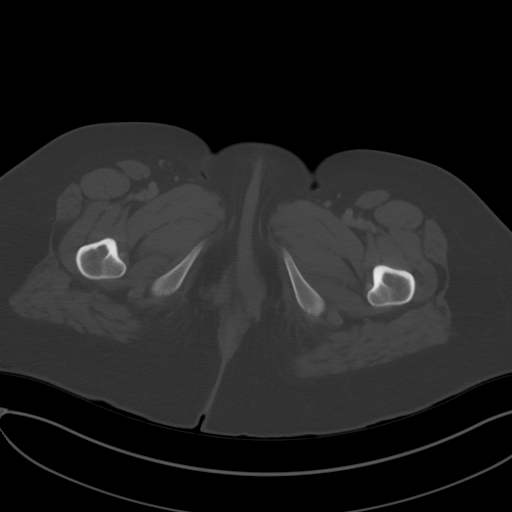
[im 13/98  soft-tissue]
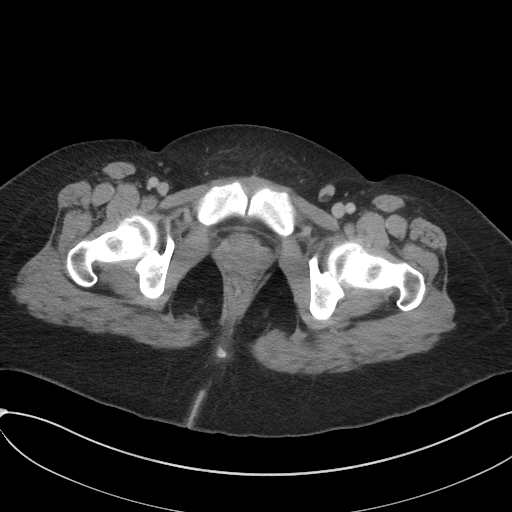
[im 21/98  soft-tissue]
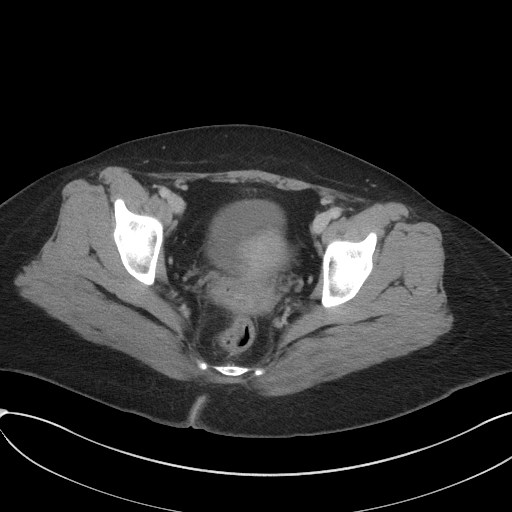
[im 29/98  soft-tissue]
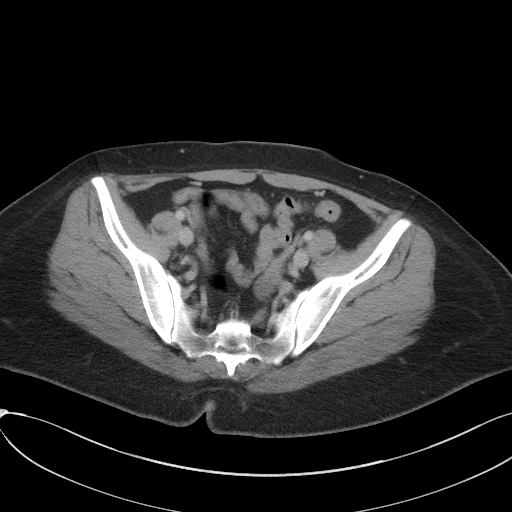
[im 33/98  soft-tissue]
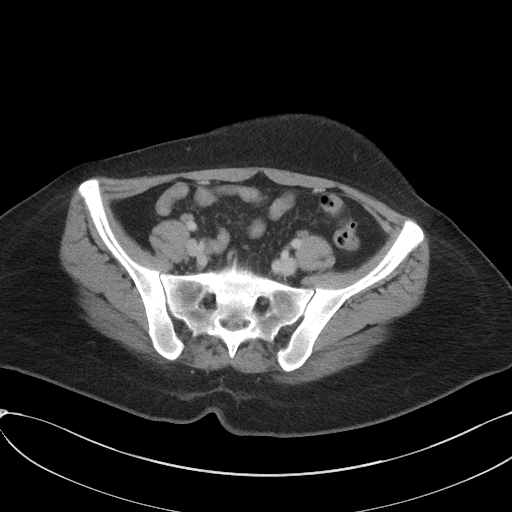
[im 41/98  soft-tissue]
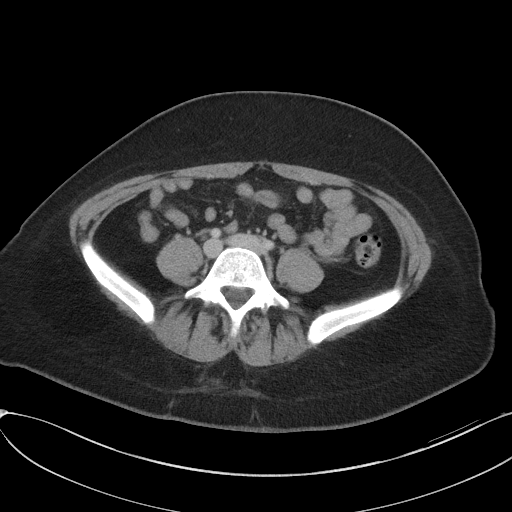
[im 49/98  soft-tissue]
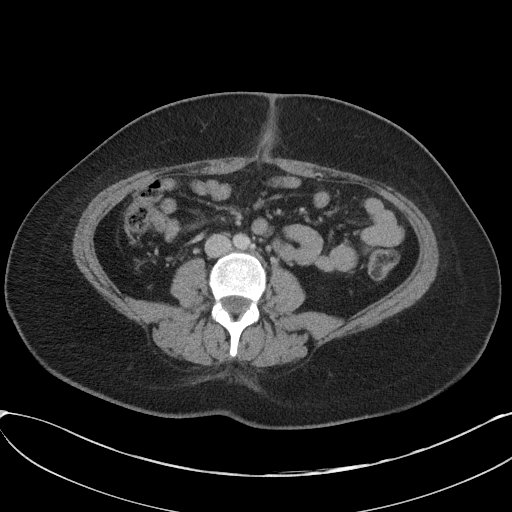
[im 57/98  soft-tissue]
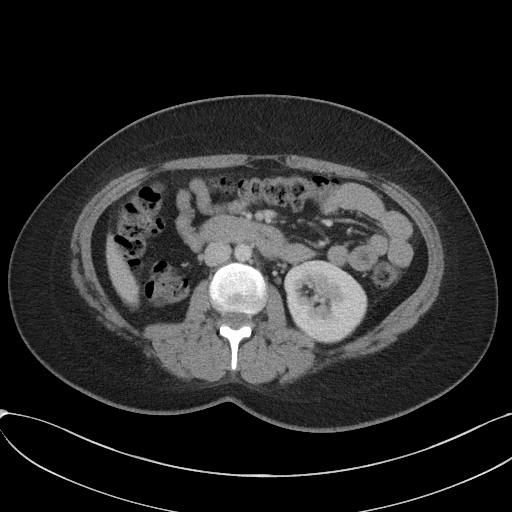
[im 65/98  soft-tissue]
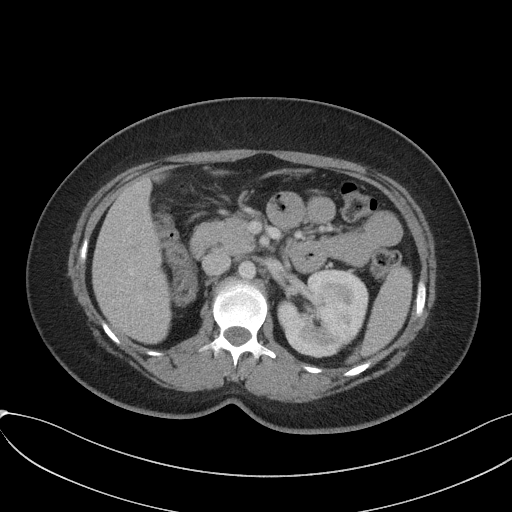
[im 65/98  bone]
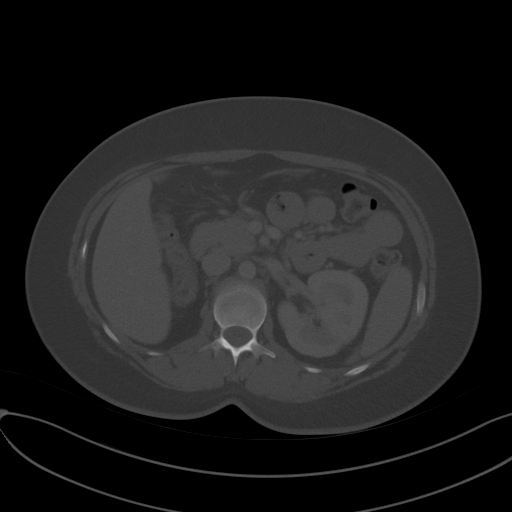
[im 69/98  soft-tissue]
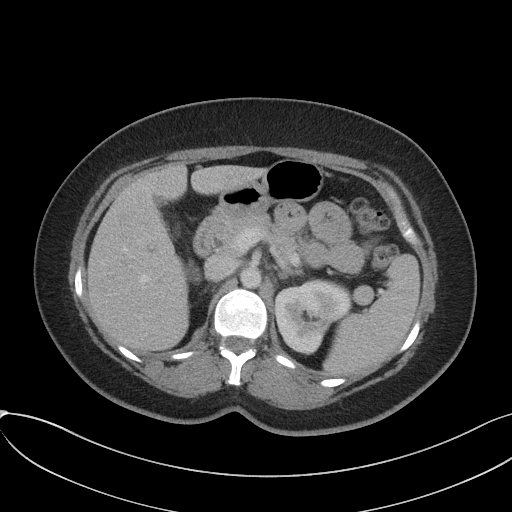
[im 77/98  soft-tissue]
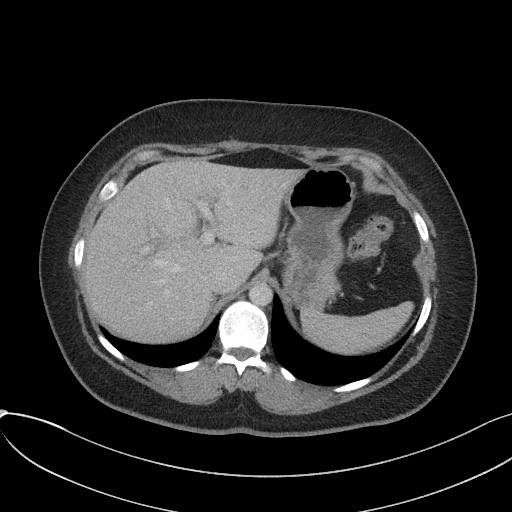
[im 85/98  soft-tissue]
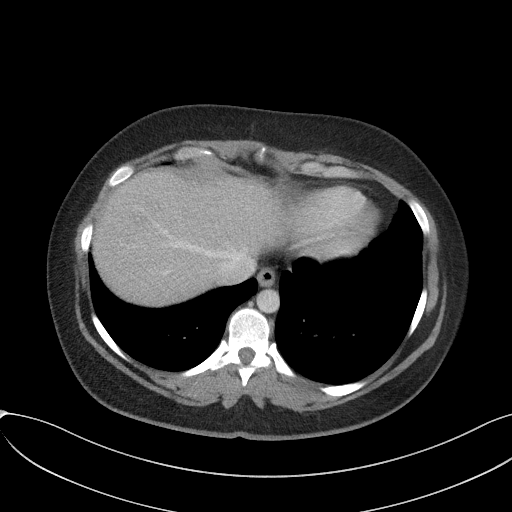
[im 93/98  soft-tissue]
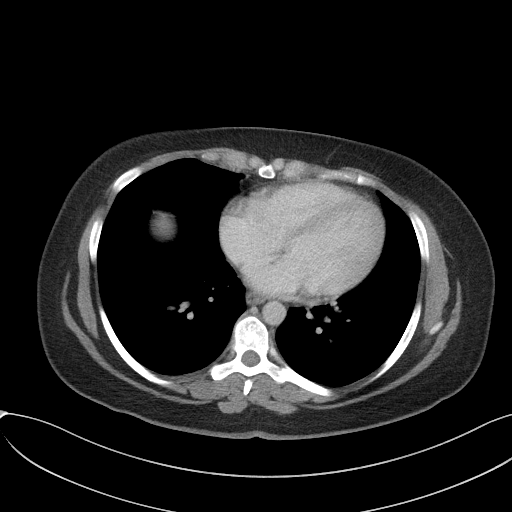

[Series 6: a/p w/ cor · coronal · 0.76mm/px · 3 of 128 slices shown]
[im 43/128  soft-tissue]
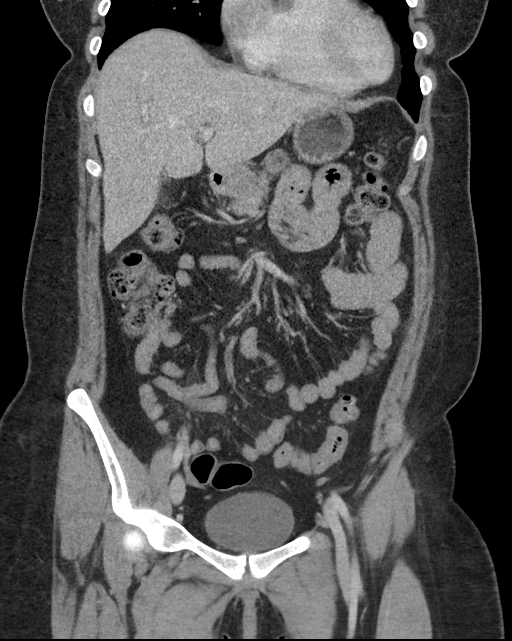
[im 57/128  soft-tissue]
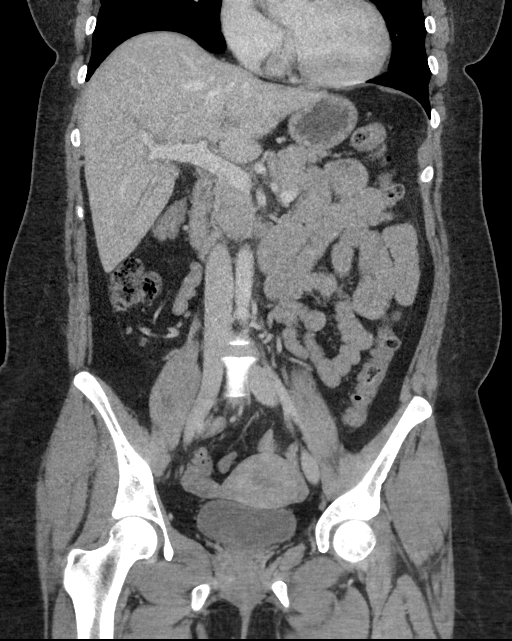
[im 71/128  soft-tissue]
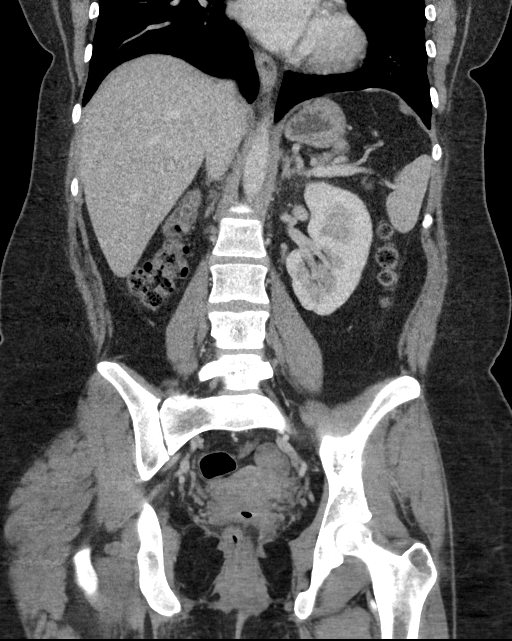

[16 of 46 positions shown; findings below may reference images not displayed]

FINDINGS: Lower chest: Lung bases are clear.

Hepatobiliary: Mild periportal edema. No biliary ductal dilation
with changes of cholecystectomy. Mild stranding in the gallbladder
fossa without organized fluid collection

Pancreas: Pancreas is normal without focal lesion.

Spleen: At spleen is normal size without focal lesion.

Adrenals/Urinary Tract: Adrenal glands are normal.

Salter kidney on the LEFT with signs of cortical scarring in the
upper pole. No evidence of hydronephrosis. Urinary bladder is
normal.

Stomach/Bowel: No acute gastrointestinal process.

Vascular/Lymphatic: Vascular structures in the abdomen are patent.
No sign of adenopathy in the abdomen or in the pelvis.

Reproductive: CT appearance of the uterus and adnexa is
unremarkable.

Other: Small amount of stranding about the umbilicus may relate to
recent cholecystectomy if laparoscopic procedure was performed. No
ascites. No focal fluid collection.

Musculoskeletal: No acute musculoskeletal process. Spinal
degenerative changes with multiple Schmorl's nodes.
IMPRESSION: 1. Mild stranding in the gallbladder fossa without organized fluid
collection.
2. Mild periportal edema.
3. Solitary kidney on the LEFT with signs of cortical scarring in
the upper pole. Correlate with any history of reflux or repeated
UTIs. This may be of clinical significance in a patient with single
kidney.
4. Small amount of stranding about the umbilicus may relate to
recent cholecystectomy if laparoscopic procedure was performed.
5. Normal appendix.

## 2020-06-15 ENCOUNTER — Other Ambulatory Visit: Payer: Self-pay

## 2020-06-15 ENCOUNTER — Ambulatory Visit: Payer: Self-pay | Admitting: Nurse Practitioner

## 2020-06-16 ENCOUNTER — Encounter: Payer: Self-pay | Admitting: Nurse Practitioner

## 2020-06-16 ENCOUNTER — Telehealth: Payer: Self-pay

## 2020-06-16 ENCOUNTER — Ambulatory Visit (INDEPENDENT_AMBULATORY_CARE_PROVIDER_SITE_OTHER): Payer: Medicaid Other | Admitting: Nurse Practitioner

## 2020-06-16 VITALS — BP 118/74 | HR 82 | Temp 96.9°F | Ht 63.0 in | Wt 197.0 lb

## 2020-06-16 DIAGNOSIS — K219 Gastro-esophageal reflux disease without esophagitis: Secondary | ICD-10-CM | POA: Diagnosis not present

## 2020-06-16 MED ORDER — PANTOPRAZOLE SODIUM 20 MG PO TBEC: 20.0000 mg | DELAYED_RELEASE_TABLET | Freq: Two times a day (BID) | ORAL | 0 refills | Status: DC

## 2020-06-16 NOTE — Telephone Encounter (Signed)
Pt called and states she needed to change the pharmacy she wants to use and wants her medication from today sent to that pharmacy. I corrected the pharmacy and canceled previous Rx and sent new Rx to corrected pharmacy. Just FYI

## 2020-06-16 NOTE — Patient Instructions (Signed)
Go to lab for breath test.   Food Choices for Gastroesophageal Reflux Disease, Adult When you have gastroesophageal reflux disease (GERD), the foods you eat and your eating habits are very important. Choosing the right foods can help ease your discomfort. Think about working with a nutrition specialist (dietitian) to help you make good choices. What are tips for following this plan?  Meals  Choose healthy foods that are low in fat, such as fruits, vegetables, whole grains, low-fat dairy products, and lean meat, fish, and poultry.  Eat small meals often instead of 3 large meals a day. Eat your meals slowly, and in a place where you are relaxed. Avoid bending over or lying down until 2-3 hours after eating.  Avoid eating meals 2-3 hours before bed.  Avoid drinking a lot of liquid with meals.  Cook foods using methods other than frying. Bake, grill, or broil food instead.  Avoid or limit: ? Chocolate. ? Peppermint or spearmint. ? Alcohol. ? Pepper. ? Black and decaffeinated coffee. ? Black and decaffeinated tea. ? Bubbly (carbonated) soft drinks. ? Caffeinated energy drinks and soft drinks.  Limit high-fat foods such as: ? Fatty meat or fried foods. ? Whole milk, cream, butter, or ice cream. ? Nuts and nut butters. ? Pastries, donuts, and sweets made with butter or shortening.  Avoid foods that cause symptoms. These foods may be different for everyone. Common foods that cause symptoms include: ? Tomatoes. ? Oranges, lemons, and limes. ? Peppers. ? Spicy food. ? Onions and garlic. ? Vinegar. Lifestyle  Maintain a healthy weight. Ask your doctor what weight is healthy for you. If you need to lose weight, work with your doctor to do so safely.  Exercise for at least 30 minutes for 5 or more days each week, or as told by your doctor.  Wear loose-fitting clothes.  Do not smoke. If you need help quitting, ask your doctor.  Sleep with the head of your bed higher than your  feet. Use a wedge under the mattress or blocks under the bed frame to raise the head of the bed. Summary  When you have gastroesophageal reflux disease (GERD), food and lifestyle choices are very important in easing your symptoms.  Eat small meals often instead of 3 large meals a day. Eat your meals slowly, and in a place where you are relaxed.  Limit high-fat foods such as fatty meat or fried foods.  Avoid bending over or lying down until 2-3 hours after eating.  Avoid peppermint and spearmint, caffeine, alcohol, and chocolate. This information is not intended to replace advice given to you by your health care provider. Make sure you discuss any questions you have with your health care provider. Document Revised: 01/30/2019 Document Reviewed: 11/14/2016 Elsevier Patient Education  2020 [employer].

## 2020-06-16 NOTE — Telephone Encounter (Signed)
Ok, thank you

## 2020-06-16 NOTE — Progress Notes (Signed)
Subjective:  Patient ID: Jacqueline Peck, female    DOB: 11-Mar-1996  Age: 24 y.o. MRN: [id number]  CC: Establish Care (New patient would like to discuss possible ulcer. Pt had gallbladder taken out in May 2021 and was informed then that she should get a PCP to check on the ulcer. )  Gastroesophageal Reflux She complains of abdominal pain, belching, globus sensation and heartburn. She reports no chest pain, no choking, no coughing, no dysphagia, no early satiety, no hoarse voice, no nausea, no sore throat, no stridor, no tooth decay, no water brash or no wheezing. This is a recurrent problem. The current episode started more than 1 month ago (02/2020). The problem has been unchanged. The heartburn is located in the substernum. The heartburn does not wake her from sleep. The heartburn does not limit her activity. The heartburn doesn't change with position. The symptoms are aggravated by certain foods, lying down and stress. Pertinent negatives include no anemia, fatigue, melena, muscle weakness, orthopnea or weight loss. Risk factors include obesity and lack of exercise. She has tried an antacid, a histamine-2 antagonist and a diet change for the symptoms. The treatment provided mild relief. Past procedures include an abdominal ultrasound. Past procedures do not include an EGD, esophageal manometry, esophageal pH monitoring, H. pylori antibody titer or a UGI.  s/p cholescystectomy Reviewed previous lab results and radiology report from ED visit: CT ABD/pelvis 02/2020 and [place] ABD 12/2019  Reviewed past Medical, Social and Family history today.  Outpatient Medications Prior to Visit  Medication Sig Dispense Refill  . acetaminophen (TYLENOL) 325 MG tablet Take 650 mg by mouth every 6 (six) hours as needed.    . famotidine (PEPCID) 20 MG tablet Take 1 tablet (20 mg total) by mouth 2 (two) times daily. 30 tablet 0  . cephALEXin (KEFLEX) 500 MG capsule Take 1 capsule (500 mg total) by mouth 3 (three) times  daily. (Patient not taking: Reported on 06/16/2020) 21 capsule 0  . cephALEXin (KEFLEX) 500 MG capsule Take 1 capsule (500 mg total) by mouth 4 (four) times daily. (Patient not taking: Reported on 06/16/2020) 28 capsule 0  . HYDROcodone-acetaminophen (NORCO/VICODIN) 5-325 MG tablet Take 1 tablet by mouth every 6 (six) hours as needed for moderate pain. (Patient not taking: Reported on 03/03/2020) 15 tablet 0  . ibuprofen (ADVIL) 200 MG tablet Take 200 mg by mouth every 6 (six) hours as needed for fever or headache. (Patient not taking: Reported on 06/16/2020)    . ibuprofen (ADVIL,MOTRIN) 200 MG tablet Take 200 mg by mouth every 6 (six) hours as needed for headache. (Patient not taking: Reported on 06/16/2020)    . meclizine (ANTIVERT) 25 MG tablet Take 1 tablet (25 mg total) by mouth 3 (three) times daily as needed for dizziness. (Patient not taking: Reported on 06/16/2020) 20 tablet 0  . ondansetron (ZOFRAN ODT) 4 MG disintegrating tablet 4mg  ODT q4 hours prn nausea/vomit (Patient not taking: Reported on 06/16/2020) 10 tablet 0  . permethrin (ELIMITE) 5 % cream Use as directed on bottle and repeat in 1 week. (Patient not taking: Reported on 01/04/2018) 60 g 1  . promethazine (PHENERGAN) 25 MG tablet Take 1 tablet (25 mg total) by mouth every 8 (eight) hours as needed for nausea or vomiting. (Patient not taking: Reported on 06/16/2020) 12 tablet 0   No facility-administered medications prior to visit.    ROS See HPI  Objective:  BP 118/74 (BP Location: Left Arm, Patient Position: Sitting, Cuff Size: Normal)  Pulse 82   Temp (!) 96.9 F (36.1 C) (Temporal)   Ht 5\' 3"  (1.6 m)   Wt 197 lb (89.4 kg)   LMP 06/07/2020   SpO2 98%   BMI 34.90 kg/m   Physical Exam Vitals and nursing note reviewed.  Constitutional:      Appearance: She is obese.  Cardiovascular:     Rate and Rhythm: Normal rate.     Pulses: Normal pulses.  Pulmonary:     Effort: Pulmonary effort is normal.  Abdominal:      General: Bowel sounds are normal. There is no distension.     Palpations: Abdomen is soft. There is no mass.     Tenderness: There is no abdominal tenderness. There is no guarding.     Hernia: No hernia is present.  Skin:    Findings: No rash.  Neurological:     Mental Status: She is alert and oriented to person, place, and time.    Assessment & Plan:  This visit occurred during the SARS-CoV-2 public health emergency.  Safety protocols were in place, including screening questions prior to the visit, additional usage of staff PPE, and extensive cleaning of exam room while observing appropriate contact time as indicated for disinfecting solutions.   Aliscia was seen today for establish care.  Diagnoses and all orders for this visit:  Gastroesophageal reflux disease without esophagitis -     H. pylori breath test -     Discontinue: pantoprazole (PROTONIX) 20 MG tablet; Take 1 tablet (20 mg total) by mouth 2 (two) times daily before a meal.   Problem List Items Addressed This Visit    None    Visit Diagnoses    Gastroesophageal reflux disease without esophagitis    -  Primary   Relevant Orders   H. pylori breath test      Follow-up: Return in about 2 weeks (around 06/30/2020) for GERD and anxiety (complete PHQ/GAD)- 08/30/2020.  , NP

## 2020-06-17 LAB — H. PYLORI BREATH TEST: H. pylori Breath Test: NOT DETECTED

## 2020-06-21 ENCOUNTER — Telehealth: Payer: Self-pay | Admitting: Nurse Practitioner

## 2020-06-21 NOTE — Telephone Encounter (Signed)
Reached out to patient regarding her insurance. They do not have [place] listed as her PCP and that may limit the services we provide. Patient states she has been working with insurance to have [place] updated as per PCP. I provided the patient with our NPI# and address to help.

## 2020-06-23 DIAGNOSIS — Z419 Encounter for procedure for purposes other than remedying health state, unspecified: Secondary | ICD-10-CM | POA: Diagnosis not present

## 2020-07-07 ENCOUNTER — Other Ambulatory Visit: Payer: Self-pay

## 2020-07-08 ENCOUNTER — Ambulatory Visit: Payer: Medicaid Other | Admitting: Nurse Practitioner

## 2020-07-13 ENCOUNTER — Ambulatory Visit (INDEPENDENT_AMBULATORY_CARE_PROVIDER_SITE_OTHER): Payer: Medicaid Other | Admitting: Nurse Practitioner

## 2020-07-13 ENCOUNTER — Other Ambulatory Visit: Payer: Self-pay

## 2020-07-13 ENCOUNTER — Encounter: Payer: Self-pay | Admitting: Nurse Practitioner

## 2020-07-13 VITALS — BP 124/82 | HR 76 | Temp 97.8°F | Ht 63.0 in | Wt 195.6 lb

## 2020-07-13 DIAGNOSIS — K219 Gastro-esophageal reflux disease without esophagitis: Secondary | ICD-10-CM | POA: Insufficient documentation

## 2020-07-13 DIAGNOSIS — F329 Major depressive disorder, single episode, unspecified: Secondary | ICD-10-CM | POA: Diagnosis not present

## 2020-07-13 DIAGNOSIS — F32A Depression, unspecified: Secondary | ICD-10-CM | POA: Insufficient documentation

## 2020-07-13 MED ORDER — PANTOPRAZOLE SODIUM 20 MG PO TBEC: 20.0000 mg | DELAYED_RELEASE_TABLET | Freq: Every day | ORAL | 1 refills | Status: DC

## 2020-07-13 NOTE — Patient Instructions (Signed)
Let me know if you change your mind about use of cymbalta  Take pantoprazole as needed.  You will be contacted to schedule appt with therapist   Living With Depression Everyone experiences occasional disappointment, sadness, and loss in their lives. When you are feeling down, blue, or sad for at least 2 weeks in a row, it may mean that you have depression. Depression can affect your thoughts and feelings, relationships, daily activities, and physical health. It is caused by changes in the way your brain functions. If you receive a diagnosis of depression, your health care provider will tell you which type of depression you have and what treatment options are available to you. If you are living with depression, there are ways to help you recover from it and also ways to prevent it from coming back. How to cope with lifestyle changes Coping with stress     Stress is your body's reaction to life changes and events, both good and bad. Stressful situations may include:  Getting married.  The death of a spouse.  Losing a job.  Retiring.  Having a baby. Stress can last just a few hours or it can be ongoing. Stress can play a major role in depression, so it is important to learn both how to cope with stress and how to think about it differently. Talk with your health care provider or a counselor if you would like to learn more about stress reduction. He or she may suggest some stress reduction techniques, such as:  Music therapy. This can include creating music or listening to music. Choose music that you enjoy and that inspires you.  Mindfulness-based meditation. This kind of meditation can be done while sitting or walking. It involves being aware of your normal breaths, rather than trying to control your breathing.  Centering prayer. This is a kind of meditation that involves focusing on a spiritual word or phrase. Choose a word, phrase, or sacred image that is meaningful to you and that  brings you peace.  Deep breathing. To do this, expand your stomach and inhale slowly through your nose. Hold your breath for 3-5 seconds, then exhale slowly, allowing your stomach muscles to relax.  Muscle relaxation. This involves intentionally tensing muscles then relaxing them. Choose a stress reduction technique that fits your lifestyle and personality. Stress reduction techniques take time and practice to develop. Set aside 5-15 minutes a day to do them. Therapists can offer training in these techniques. The training may be covered by some insurance plans. Other things you can do to manage stress include:  Keeping a stress diary. This can help you learn what triggers your stress and ways to control your response.  Understanding what your limits are and saying no to requests or events that lead to a schedule that is too full.  Thinking about how you respond to certain situations. You may not be able to control everything, but you can control how you react.  Adding humor to your life by watching funny films or TV shows.  Making time for activities that help you relax and not feeling guilty about spending your time this way.  Medicines Your health care provider may suggest certain medicines if he or she feels that they will help improve your condition. Avoid using alcohol and other substances that may prevent your medicines from working properly (may interact). It is also important to:  Talk with your pharmacist or health care provider about all the medicines that you take, their possible  side effects, and what medicines are safe to take together.  Make it your goal to take part in all treatment decisions (shared decision-making). This includes giving input on the side effects of medicines. It is [place] if shared decision-making with your health care provider is part of your total treatment plan. If your health care provider prescribes a medicine, you may not notice the full benefits of it for  4-8 weeks. Most people who are treated for depression need to be on medicine for at least 6-12 months after they feel better. If you are taking medicines as part of your treatment, do not stop taking medicines without first talking to your health care provider. You may need to have the medicine slowly decreased (tapered) over time to decrease the risk of harmful side effects. Relationships Your health care provider may suggest family therapy along with individual therapy and drug therapy. While there may not be family problems that are causing you to feel depressed, it is still important to make sure your family learns as much as they can about your mental health. Having your family's support can help make your treatment successful. How to recognize changes in your condition Everyone has a different response to treatment for depression. Recovery from major depression happens when you have not had signs of major depression for two months. This may mean that you will start to:  Have more interest in doing activities.  Feel less hopeless than you did 2 months ago.  Have more energy.  Overeat less often, or have better or improving appetite.  Have better concentration. Your health care provider will work with you to decide the next steps in your recovery. It is also important to recognize when your condition is getting worse. Watch for these signs:  Having fatigue or low energy.  Eating too much or too little.  Sleeping too much or too little.  Feeling restless, agitated, or hopeless.  Having trouble concentrating or making decisions.  Having unexplained physical complaints.  Feeling irritable, angry, or aggressive. Get help as soon as you or your family members notice these symptoms coming back. How to get support and help from others How to talk with friends and family members about your condition  Talking to friends and family members about your condition can provide you with one way  to get support and guidance. Reach out to trusted friends or family members, explain your symptoms to them, and let them know that you are working with a health care provider to treat your depression. Financial resources Not all insurance plans cover mental health care, so it is important to check with your insurance carrier. If paying for co-pays or counseling services is a problem, search for a local or county mental health care center. They may be able to offer public mental health care services at low or no cost when you are not able to see a private health care provider. If you are taking medicine for depression, you may be able to get the generic form, which may be less expensive. Some makers of prescription medicines also offer help to patients who cannot afford the medicines they need. Follow these instructions at home:   Get the right amount and quality of sleep.  Cut down on using caffeine, tobacco, alcohol, and other potentially harmful substances.  Try to exercise, such as walking or lifting small weights.  Take over-the-counter and prescription medicines only as told by your health care provider.  Eat a healthy diet that includes  plenty of vegetables, fruits, whole grains, low-fat dairy products, and lean protein. Do not eat a lot of foods that are high in solid fats, added sugars, or salt.  Keep all follow-up visits as told by your health care provider. This is important. Contact a health care provider if:  You stop taking your antidepressant medicines, and you have any of these symptoms: ? Nausea. ? Headache. ? Feeling lightheaded. ? Chills and body aches. ? Not being able to sleep (insomnia).  You or your friends and family think your depression is getting worse. Get help right away if:  You have thoughts of hurting yourself or others. If you ever feel like you may hurt yourself or others, or have thoughts about taking your own life, get help right away. You can go to  your nearest emergency department or call:  Your local emergency services (911 in the [place]  A suicide crisis helpline, such as the National Suicide Prevention Lifeline at 810-385-4456. This is open 24-hours a day. Summary  If you are living with depression, there are ways to help you recover from it and also ways to prevent it from coming back.  Work with your health care team to create a management plan that includes counseling, stress management techniques, and healthy lifestyle habits. This information is not intended to replace advice given to you by your health care provider. Make sure you discuss any questions you have with your health care provider. Document Revised: 01/31/2019 Document Reviewed: 09/11/2016 Elsevier Patient Education  2020 [employer].  Postpartum Baby Blues The postpartum period begins right after the birth of a baby. During this time, there is often a lot of joy and excitement. It is also a time of many changes in the life of the parents. No matter how many times a mother gives birth, each child brings new challenges to the family, including different ways of relating to one another. It is common to have feelings of excitement along with confusing changes in moods, emotions, and thoughts. You may feel happy one minute and sad or stressed the next. These feelings of sadness usually happen in the period right after you have your baby, and they go away within a week or two. This is called the "baby blues." What are the causes? There is no known cause of baby blues. It is likely caused by a combination of factors. However, changes in hormone levels after childbirth are believed to trigger some of the symptoms. Other factors that can play a role in these mood changes include:  Lack of sleep.  Stressful life events, such as poverty, caring for a loved one, or death of a loved one.  Genetics. What are the signs or symptoms? Symptoms of this condition  include:  Brief changes in mood, such as going from extreme happiness to sadness.  Decreased concentration.  Difficulty sleeping.  Crying spells and tearfulness.  Loss of appetite.  Irritability.  Anxiety. If the symptoms of baby blues last for more than 2 weeks or become more severe, you may have postpartum depression. How is this diagnosed? This condition is diagnosed based on an evaluation of your symptoms. There are no medical or lab tests that lead to a diagnosis, but there are various questionnaires that a health care provider may use to identify women with the baby blues or postpartum depression. How is this treated? Treatment is not needed for this condition. The baby blues usually go away on their own in 1-2 weeks. Social support is  often all that is needed. You will be encouraged to get adequate sleep and rest. Follow these instructions at home: Lifestyle      Get as much rest as you can. Take a nap when the baby sleeps.  Exercise regularly as told by your health care provider. Some women find yoga and walking to be helpful.  Eat a balanced and nourishing diet. This includes plenty of fruits and vegetables, whole grains, and lean proteins.  Do little things that you enjoy. Have a cup of tea, take a bubble bath, read your favorite magazine, or listen to your favorite music.  Avoid alcohol.  Ask for help with household chores, cooking, grocery shopping, or running errands. Do not try to do everything yourself. Consider hiring a postpartum doula to help. This is a professional who specializes in providing support to new mothers.  Try not to make any major life changes during pregnancy or right after giving birth. This can add stress. General instructions  Talk to people close to you about how you are feeling. Get support from your partner, family members, friends, or other new moms. You may want to join a support group.  Find ways to cope with stress. This may  include: ? Writing your thoughts and feelings in a journal. ? Spending time outside. ? Spending time with people who make you laugh.  Try to stay positive in how you think. Think about the things you are grateful for.  Take over-the-counter and prescription medicines only as told by your health care provider.  Let your health care provider know if you have any concerns.  Keep all postpartum visits as told by your health care provider. This is important. Contact a health care provider if:  Your baby blues do not go away after 2 weeks. Get help right away if:  You have thoughts of taking your own life (suicidal thoughts).  You think you may harm the baby or other people.  You see or hear things that are not there (hallucinations). Summary  After giving birth, you may feel happy one minute and sad or stressed the next. Feelings of sadness that happen right after the baby is born and go away after a week or two are called the "baby blues."  You can manage the baby blues by getting enough rest, eating a healthy diet, exercising, spending time with supportive people, and finding ways to cope with stress.  If feelings of sadness and stress last longer than 2 weeks or get in the way of caring for your baby, talk to your health care provider. This may mean you have postpartum depression. This information is not intended to replace advice given to you by your health care provider. Make sure you discuss any questions you have with your health care provider. Document Revised: 01/31/2019 Document Reviewed: 12/05/2016 Elsevier Patient Education  2020 [employer]

## 2020-07-13 NOTE — Progress Notes (Signed)
Subjective:  Patient ID: Jacqueline Peck, female    DOB: [birth year]  Age: 24 y.o. MRN: [id number]  CC: Follow-up (2 week f/u on GERD and anxiety, pt states the medication has helped. )  Depression      The patient presents with depression.  This is a chronic problem.  The current episode started more than 1 year ago.   The onset quality is gradual.   The problem occurs daily.  The problem has been gradually worsening since onset.  Associated symptoms include decreased concentration, fatigue, helplessness, insomnia, irritable, appetite change, body aches, myalgias, indigestion and sad.  Associated symptoms include no hopelessness, no restlessness, no decreased interest, no headaches and no suicidal ideas.     The symptoms are aggravated by family issues.  Past treatments include nothing.  Past medical history includes anxiety and depression.     Pertinent negatives include no eating disorder, no obsessive-compulsive disorder, no post-traumatic stress disorder, no suicide attempts and no head trauma. mood worsen during pregnancy and childbirth She is breastfeeding her 62mos old daughter at this time. She is not interested in use of medication at this time.  Gastroesophageal reflux disease without esophagitis Resolved with pantoprazole and diet changes  Depressive disorder Chronic, worse with pregnancy. Breastfeeding at this time. Not interested in use of medication at this time Support system includes: husband, sister, mother and mother in law.  Agreed to CBT referral and joining mother-and-me support group. She was able to contract for safety and inform for family or call 911 or call office if mood worsens. F/up in 75months  Depression screen PHQ 2/9 07/13/2020  Decreased Interest 1  Down, Depressed, Hopeless 3  PHQ - 2 Score 4  Altered sleeping 3  Tired, decreased energy 3  Change in appetite 3  Feeling bad or failure about yourself  3  Trouble concentrating 3  Moving slowly or  fidgety/restless 2  Suicidal thoughts 1  PHQ-9 Score 22  Difficult doing work/chores Extremely dIfficult   GAD 7 : Generalized Anxiety Score 07/13/2020  Nervous, Anxious, on Edge 3  Control/stop worrying 3  Worry too much - different things 3  Trouble relaxing 2  Restless 1  Easily annoyed or irritable 3  Afraid - awful might happen 3  Total GAD 7 Score 18  Anxiety Difficulty Extremely difficult   Reviewed past Medical, Social and Family history today.  Outpatient Medications Prior to Visit  Medication Sig Dispense Refill  . acetaminophen (TYLENOL) 325 MG tablet Take 650 mg by mouth every 6 (six) hours as needed.    . famotidine (PEPCID) 20 MG tablet Take 1 tablet (20 mg total) by mouth 2 (two) times daily. 30 tablet 0  . pantoprazole (PROTONIX) 20 MG tablet Take 1 tablet (20 mg total) by mouth 2 (two) times daily before a meal. 30 tablet 0   No facility-administered medications prior to visit.    ROS See HPI  Objective:  BP 124/82 (BP Location: Left Arm, Patient Position: Sitting, Cuff Size: Normal)   Pulse 76   Temp 97.8 F (36.6 C) (Temporal)   Ht 5\' 3"  (1.6 m)   Wt 195 lb 9.6 oz (88.7 kg)   SpO2 99%   BMI 34.65 kg/m   Physical Exam Vitals reviewed.  Constitutional:      General: She is irritable.     Appearance: She is obese.  Neurological:     Mental Status: She is alert.  Psychiatric:        Attention and Perception:  Attention normal.        Mood and Affect: Mood is anxious and depressed.        Speech: Speech normal.        Behavior: Behavior is cooperative.        Thought Content: Thought content is not paranoid or delusional. Thought content includes suicidal ideation. Thought content does not include homicidal ideation. Thought content does not include homicidal or suicidal plan.        Cognition and Memory: Cognition normal.        Judgment: Judgment normal.     Assessment & Plan:  This visit occurred during the SARS-CoV-2 public health emergency.   Safety protocols were in place, including screening questions prior to the visit, additional usage of staff PPE, and extensive cleaning of exam room while observing appropriate contact time as indicated for disinfecting solutions.   Jacqueline Peck was seen today for follow-up.  Diagnoses and all orders for this visit:  Depressive disorder -     Ambulatory referral to Psychology  Gastroesophageal reflux disease without esophagitis -     pantoprazole (PROTONIX) 20 MG tablet; Take 1 tablet (20 mg total) by mouth daily.    Problem List Items Addressed This Visit      Digestive   Gastroesophageal reflux disease without esophagitis    Resolved with pantoprazole and diet changes      Relevant Medications   pantoprazole (PROTONIX) 20 MG tablet     Other   Depressive disorder - Primary    Chronic, worse with pregnancy. Breastfeeding at this time. Not interested in use of medication at this time Support system includes: husband, sister, mother and mother in law.  Agreed to CBT referral and joining mother-and-me support group. She was able to contract for safety and inform for family or call 911 or call office if mood worsens. F/up in 38months      Relevant Orders   Ambulatory referral to Psychology      Follow-up: Return in about 2 months (around 09/12/2020) for anxiety and depression (F2F or video, ).  Alysia Penna, NP

## 2020-07-13 NOTE — Assessment & Plan Note (Signed)
Chronic, worse with pregnancy. Breastfeeding at this time. Not interested in use of medication at this time Support system includes: husband, sister, mother and mother in law.  Agreed to CBT referral and joining mother-and-me support group. She was able to contract for safety and inform for family or call 911 or call office if mood worsens. F/up in 5months

## 2020-07-13 NOTE — Assessment & Plan Note (Signed)
Resolved with pantoprazole and diet changes

## 2020-07-14 ENCOUNTER — Other Ambulatory Visit: Payer: Self-pay | Admitting: Nurse Practitioner

## 2020-07-14 DIAGNOSIS — F32A Depression, unspecified: Secondary | ICD-10-CM

## 2020-07-14 DIAGNOSIS — F329 Major depressive disorder, single episode, unspecified: Secondary | ICD-10-CM

## 2020-07-23 DIAGNOSIS — Z419 Encounter for procedure for purposes other than remedying health state, unspecified: Secondary | ICD-10-CM | POA: Diagnosis not present

## 2020-08-09 ENCOUNTER — Ambulatory Visit (INDEPENDENT_AMBULATORY_CARE_PROVIDER_SITE_OTHER): Payer: Medicaid Other | Admitting: Licensed Clinical Social Worker

## 2020-08-09 ENCOUNTER — Other Ambulatory Visit: Payer: Self-pay

## 2020-08-09 DIAGNOSIS — O99345 Other mental disorders complicating the puerperium: Secondary | ICD-10-CM

## 2020-08-09 DIAGNOSIS — F339 Major depressive disorder, recurrent, unspecified: Secondary | ICD-10-CM
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
## 2020-08-23 DIAGNOSIS — Z419 Encounter for procedure for purposes other than remedying health state, unspecified: Secondary | ICD-10-CM | POA: Diagnosis not present

## 2020-08-24 ENCOUNTER — Other Ambulatory Visit: Payer: Self-pay

## 2020-08-24 ENCOUNTER — Telehealth (INDEPENDENT_AMBULATORY_CARE_PROVIDER_SITE_OTHER): Payer: Medicaid Other | Admitting: Psychiatry

## 2020-08-24 ENCOUNTER — Telehealth: Payer: Self-pay | Admitting: Nurse Practitioner

## 2020-08-24 ENCOUNTER — Encounter (HOSPITAL_COMMUNITY): Payer: Self-pay

## 2020-08-24 DIAGNOSIS — F339 Major depressive disorder, recurrent, unspecified: Secondary | ICD-10-CM | POA: Insufficient documentation

## 2020-08-24 DIAGNOSIS — F321 Major depressive disorder, single episode, moderate: Secondary | ICD-10-CM

## 2020-08-24 MED ORDER — SERTRALINE HCL 50 MG PO TABS: 50.0000 mg | ORAL_TABLET | Freq: Every day | ORAL | 2 refills | Status: DC

## 2020-08-24 NOTE — Telephone Encounter (Signed)
Patient is calling and wanted to speak to someone regarding medication, please advise. CB is [phone]

## 2020-08-24 NOTE — Progress Notes (Signed)
Psychiatric Initial Adult Assessment   Patient Identification: Jacqueline Peck MRN:  [id number] Date of Evaluation:  08/24/2020 Referral Source: self Chief Complaint:   Chief Complaint    Depression; Anxiety     Visit Diagnosis:    ICD-10-CM   1. Major depressive disorder, single episode, moderate (HCC)  F32.1     History of Present Illness:   24 year old Caucasian female presents for new patient evaluation for her symptoms of depression. She states that she has been experiencing depressive symptoms x past 2 years, but her depressive symptoms have gotten worse since she had her daughter, Vena [place], 11 months ago. She states that her symptoms of depression include "just feeling really down" and struggling with motivation and energy to get out of bed in the morning. She denies having taken any medications for anxiety or depression in the past. PHQ-9 score of 17 today. She reports having support from her sister, mother, and mother in law, and also her husband. She states that she recently had an argument with her husband related to her lack of desire for engagement in sexual activity since the birth of her daughter, but reports that she is otherwise happy and feels safe in her marriage of 7 years. She states that she typically sleeps around 8 hours/night and is co-sleeping with her daughter and her husband as her daughter is still breastfeeding overnight and occasionally during the day. Patient currently denies suicidal ideations, homicidal ideations and auditory/visual hallucinations.   Associated Signs/Symptoms: Depression Symptoms:  depressed mood, insomnia, disturbed sleep, (Hypo) Manic Symptoms:  none Anxiety Symptoms:  none Psychotic Symptoms:  none PTSD Symptoms: NA  Past Psychiatric History: none  Previous Psychotropic Medications: No   Substance Abuse History in the last 12 months:  No.  Consequences of Substance Abuse: NA  Past Medical History: History reviewed. No pertinent  past medical history.  Past Surgical History:  Procedure Laterality Date  . CHOLECYSTECTOMY    . kidney removed    . NEPHRECTOMY Right     Family Psychiatric History: none  Family History:  Family History  Family history unknown: Yes    Social History:   Social History   Socioeconomic History  . Marital status: Married    Spouse name: Not on file  . Number of children: 1  . Years of education: Not on file  . Highest education level: Not on file  Occupational History  . Not on file  Tobacco Use  . Smoking status: Never Smoker  . Smokeless tobacco: Never Used  Vaping Use  . Vaping Use: Never used  Substance and Sexual Activity  . Alcohol use: Not Currently  . Drug use: Never  . Sexual activity: Yes    Birth control/protection: Condom  Other Topics Concern  . Not on file  Social History Narrative  . Not on file   Social Determinants of Health   Financial Resource Strain:   . Difficulty of Paying Living Expenses: Not on file  Food Insecurity:   . Worried About Programme researcher, broadcasting/film/video in the Last Year: Not on file  . Ran Out of Food in the Last Year: Not on file  Transportation Needs:   . Lack of Transportation (Medical): Not on file  . Lack of Transportation (Non-Medical): Not on file  Physical Activity:   . Days of Exercise per Week: Not on file  . Minutes of Exercise per Session: Not on file  Stress:   . Feeling of Stress : Not on file  Social Connections:   . Frequency of Communication with Friends and Family: Not on file  . Frequency of Social Gatherings with Friends and Family: Not on file  . Attends Religious Services: Not on file  . Active Member of Clubs or Organizations: Not on file  . Attends Banker Meetings: Not on file  . Marital Status: Not on file    Additional Social History: lives with her husband of 11 years, one-year old baby  Allergies:  No Known Allergies  Metabolic Disorder Labs: No results found for: HGBA1C, MPG No  results found for: PROLACTIN No results found for: CHOL, TRIG, HDL, CHOLHDL, VLDL, LDLCALC No results found for: TSH  Therapeutic Level Labs: No results found for: LITHIUM No results found for: CBMZ No results found for: VALPROATE  Current Medications: Current Outpatient Medications  Medication Sig Dispense Refill  . acetaminophen (TYLENOL) 325 MG tablet Take 650 mg by mouth every 6 (six) hours as needed.    . pantoprazole (PROTONIX) 20 MG tablet Take 1 tablet (20 mg total) by mouth daily. 30 tablet 1   No current facility-administered medications for this visit.    Musculoskeletal: Strength & Muscle Tone: within normal limits Gait & Station: normal Patient leans: N/A  Psychiatric Specialty Exam: Review of Systems  Psychiatric/Behavioral: Positive for dysphoric mood and sleep disturbance. The patient is nervous/anxious.   All other systems reviewed and are negative.   unknown if currently breastfeeding.There is no height or weight on file to calculate BMI.  General Appearance: Casual  Eye Contact:  Good  Speech:  Clear and Coherent  Volume:  Normal  Mood:  Anxious and Depressed  Affect:  Congruent  Thought Process:  Coherent and Descriptions of Associations: Intact  Orientation:  Full (Time, Place, and Person)  Thought Content:  WDL and Logical  Suicidal Thoughts:  No  Homicidal Thoughts:  No  Memory:  Immediate;   Good Recent;   Good Remote;   Good  Judgement:  Good  Insight:  Good  Psychomotor Activity:  Decreased  Concentration:  Concentration: Good and Attention Span: Good  Recall:  Good  Fund of Knowledge:Good  Language: Good  Akathisia:  No  Handed:  Right  AIMS (if indicated):  not done  Assets:  Housing Leisure Time Physical Health Resilience Social Support  ADL's:  Intact  Cognition: WNL  Sleep:  Fair   Screenings: GAD-7     Office Visit from 07/13/2020 in [facility]  Total GAD-7 Score 18    PHQ2-9     Video Visit from  08/24/2020 in [facility] Office Visit from 07/13/2020 in [facility]  PHQ-2 Total Score 6 4  PHQ-9 Total Score 19 22      Assessment and Plan:  Major depressive disorder, single episode, moderate: -Start Zoloft 25 mg daily and increase to 50 mg daily within 3 days -Follow up in one month  Virtual Visit via Video Note  I connected with [name] on 08/24/20 at  2:00 PM EDT by a video enabled telemedicine application and verified that I am speaking with the correct person using two identifiers.  Location: Patient: Home Provider: [facility]   I discussed the limitations of evaluation and management by telemedicine and the availability of in person appointments. The patient expressed understanding and agreed to proceed.  Follow Up Instructions: Follow up in one month   I discussed the assessment and treatment plan with the patient. The patient was provided an  opportunity to ask questions and all were answered. The patient agreed with the plan and demonstrated an understanding of the instructions.   The patient was advised to call back or seek an in-person evaluation if the symptoms worsen or if the condition fails to improve as anticipated.  I provided 45 minutes of non-face-to-face time during this encounter.   Nanine Means, NP   Nanine Means, NP 11/2/20212:32 PM

## 2020-08-26 NOTE — Telephone Encounter (Signed)
Pt states at last ov depression and anxiety was discussed and states her psychiatrist is suggesting she take Zoloft and she wanted to know if this was okay since she is breast feeding. Please advise

## 2020-08-27 NOTE — Telephone Encounter (Signed)
Studies indicate that very low concentrations of zoloft was found in breast milk, so I think it should be fine.

## 2020-08-27 NOTE — Telephone Encounter (Signed)
Patient notified and verbalized understanding. 

## 2020-09-13 ENCOUNTER — Telehealth: Payer: Medicaid Other | Admitting: Nurse Practitioner

## 2020-09-14 ENCOUNTER — Encounter: Payer: Self-pay | Admitting: Nurse Practitioner

## 2020-09-14 ENCOUNTER — Telehealth (INDEPENDENT_AMBULATORY_CARE_PROVIDER_SITE_OTHER): Payer: Medicaid Other | Admitting: Nurse Practitioner

## 2020-09-14 VITALS — Ht 63.0 in | Wt 193.0 lb

## 2020-09-14 DIAGNOSIS — F321 Major depressive disorder, single episode, moderate: Secondary | ICD-10-CM

## 2020-09-14 NOTE — Progress Notes (Signed)
Virtual Visit via Video Note  I connected with@ on 09/14/20 at  1:30 PM EST by a video enabled telemedicine application and verified that I am speaking with the correct person using two identifiers.  Location: Patient:Home Provider: Office Participants: patient and provider  I discussed the limitations of evaluation and management by telemedicine and the availability of in person appointments. I also discussed with the patient that there may be a patient responsible charge related to this service. The patient expressed understanding and agreed to proceed.  BH:ALPFXTK and depression  History of Present Illness: Major depressive disorder, single episode, moderate (HCC) Stable mood Did not start zoloft as prescribed by psychiatry due to concerns about possible side effects: worsening mood, GERD and breastfeeding. She plans to wean her daughter off breastfeeding when she turns 1 Advised to take medication with food to minimize GI side effects. Advised to start with 1/2tab daily x 7-10days, then increase to 1tab daily. She is to call psychiatry or this office if she has any side effects or additional concerns. Advised about Headspace phone app. Next appt with psychiatry 09/21/20 and with counselor 09/24/2020 F/up prn  Depression screen [facility] 2/9 09/14/2020 08/24/2020 07/13/2020  Decreased Interest 1 3 1   Down, Depressed, Hopeless 1 3 3   PHQ - 2 Score 2 6 4   Altered sleeping 1 2 3   Tired, decreased energy 1 2 3   Change in appetite 1 1 3   Feeling bad or failure about yourself  1 3 3   Trouble concentrating 1 3 3   Moving slowly or fidgety/restless 0 1 2  Suicidal thoughts 0 1 1  PHQ-9 Score 7 19 22   Difficult doing work/chores Somewhat difficult Very difficult Extremely dIfficult    GAD 7 : Generalized Anxiety Score 09/14/2020 07/13/2020  Nervous, Anxious, on Edge 1 3  Control/stop worrying 1 3  Worry too much - different things 1 3  Trouble relaxing 1 2  Restless 2 1  Easily annoyed  or irritable 1 3  Afraid - awful might happen 1 3  Total GAD 7 Score 8 18  Anxiety Difficulty Extremely difficult Extremely difficult   Observations/Objective: Alert and oriented, normal mood and thought  Assessment and Plan: Aireana was seen today for follow-up.  Diagnoses and all orders for this visit:  Major depressive disorder, single episode, moderate (HCC)   Follow Up Instructions: See above   I discussed the assessment and treatment plan with the patient. The patient was provided an opportunity to ask questions and all were answered. The patient agreed with the plan and demonstrated an understanding of the instructions.   The patient was advised to call back or seek an in-person evaluation if the symptoms worsen or if the condition fails to improve as anticipated.  , NP

## 2020-09-14 NOTE — Assessment & Plan Note (Signed)
Stable mood Did not start zoloft as prescribed by psychiatry due to concerns about possible side effects: worsening mood, GERD and breastfeeding. She plans to wean her daughter off breastfeeding when she turns 1 Advised to take medication with food to minimize GI side effects. Advised to start with 1/2tab daily x 7-10days, then increase to 1tab daily. She is to call psychiatry or this office if she has any side effects or additional concerns. Advised about Headspace phone app. Next appt with psychiatry 09/21/20 and with counselor 09/24/2020 F/up prn

## 2020-09-21 ENCOUNTER — Encounter (HOSPITAL_COMMUNITY): Payer: Self-pay

## 2020-09-21 ENCOUNTER — Telehealth (INDEPENDENT_AMBULATORY_CARE_PROVIDER_SITE_OTHER): Payer: Medicaid Other | Admitting: Psychiatry

## 2020-09-21 ENCOUNTER — Other Ambulatory Visit: Payer: Self-pay

## 2020-09-21 DIAGNOSIS — O99345 Other mental disorders complicating the puerperium: Secondary | ICD-10-CM

## 2020-09-21 DIAGNOSIS — F339 Major depressive disorder, recurrent, unspecified: Secondary | ICD-10-CM | POA: Diagnosis not present

## 2020-09-21 NOTE — Progress Notes (Signed)
Psychiatric Initial Adult Assessment   Patient Identification: Jacqueline Peck MRN:  [id number] Date of Evaluation:  09/21/2020 Referral Source: self Chief Complaint:    Visit Diagnosis:  Major depressive disorder, recurrent, moderate  History of Present Illness:   24 year old Caucasian female presents for new patient evaluation for her symptoms of depression.  Depression and anxiety 7/10 in the past two weeks related to her and her husband arguing.  Past week, her sleep initiation is good, maintenance is fair, awakens once during the night and returns to sleep in about 30 minutes.  Tired during the day.  Stays home with her one-year-old with support from her mother and sister.  Scheduled to start Zoloft but was hesitant because of breastfeeding, her Ob/Gyn approved also.  Appetite fluctuates.  She states that she has been experiencing depressive symptoms x past 2 years, but her depressive symptoms have gotten worse since she had her daughter, Vena Austria, a year ago.  Patient currently denies suicidal ideations, homicidal ideations and auditory/visual hallucinations. She would like to follow up to re-evaluate her options in 3 months once she has more therapy completed with her therapist.  Associated Signs/Symptoms: Depression Symptoms:  depressed mood, insomnia, disturbed sleep, (Hypo) Manic Symptoms:  none Anxiety Symptoms:  none Psychotic Symptoms:  none PTSD Symptoms: NA  Past Psychiatric History: none  Previous Psychotropic Medications: No   Substance Abuse History in the last 12 months:  No.  Consequences of Substance Abuse: NA  Past Medical History: No past medical history on file.  Past Surgical History:  Procedure Laterality Date  . CHOLECYSTECTOMY    . kidney removed    . NEPHRECTOMY Right     Family Psychiatric History: none  Family History:  Family History  Family history unknown: Yes    Social History:   Social History   Socioeconomic History  . Marital  status: Married    Spouse name: Not on file  . Number of children: 1  . Years of education: Not on file  . Highest education level: Not on file  Occupational History  . Not on file  Tobacco Use  . Smoking status: Never Smoker  . Smokeless tobacco: Never Used  Vaping Use  . Vaping Use: Never used  Substance and Sexual Activity  . Alcohol use: Not Currently  . Drug use: Never  . Sexual activity: Yes    Birth control/protection: Condom  Other Topics Concern  . Not on file  Social History Narrative  . Not on file   Social Determinants of Health   Financial Resource Strain:   . Difficulty of Paying Living Expenses: Not on file  Food Insecurity:   . Worried About Programme researcher, broadcasting/film/video in the Last Year: Not on file  . Ran Out of Food in the Last Year: Not on file  Transportation Needs:   . Lack of Transportation (Medical): Not on file  . Lack of Transportation (Non-Medical): Not on file  Physical Activity:   . Days of Exercise per Week: Not on file  . Minutes of Exercise per Session: Not on file  Stress:   . Feeling of Stress : Not on file  Social Connections:   . Frequency of Communication with Friends and Family: Not on file  . Frequency of Social Gatherings with Friends and Family: Not on file  . Attends Religious Services: Not on file  . Active Member of Clubs or Organizations: Not on file  . Attends Banker Meetings: Not on file  .  Marital Status: Not on file    Additional Social History: lives with her husband of 41 years, one-year old baby  Allergies:  No Known Allergies  Metabolic Disorder Labs: No results found for: HGBA1C, MPG No results found for: PROLACTIN No results found for: CHOL, TRIG, HDL, CHOLHDL, VLDL, LDLCALC No results found for: TSH  Therapeutic Level Labs: No results found for: LITHIUM No results found for: CBMZ No results found for: VALPROATE  Current Medications: Current Outpatient Medications  Medication Sig Dispense Refill   . acetaminophen (TYLENOL) 325 MG tablet Take 650 mg by mouth every 6 (six) hours as needed.    . pantoprazole (PROTONIX) 20 MG tablet Take 1 tablet (20 mg total) by mouth daily. 30 tablet 1  . sertraline (ZOLOFT) 50 MG tablet Take 1 tablet (50 mg total) by mouth daily. 30 tablet 2   No current facility-administered medications for this visit.    Musculoskeletal: Strength & Muscle Tone: within normal limits Gait & Station: normal Patient leans: N/A  Psychiatric Specialty Exam: Review of Systems  Psychiatric/Behavioral: Positive for dysphoric mood and sleep disturbance. The patient is nervous/anxious.   All other systems reviewed and are negative.   unknown if currently breastfeeding.There is no height or weight on file to calculate BMI.  General Appearance: Casual  Eye Contact:  Good  Speech:  Clear and Coherent  Volume:  Normal  Mood:  Anxious and Depressed  Affect:  Congruent  Thought Process:  Coherent and Descriptions of Associations: Intact  Orientation:  Full (Time, Place, and Person)  Thought Content:  WDL and Logical  Suicidal Thoughts:  No  Homicidal Thoughts:  No  Memory:  Immediate;   Good Recent;   Good Remote;   Good  Judgement:  Good  Insight:  Good  Psychomotor Activity:  Decreased  Concentration:  Concentration: Good and Attention Span: Good  Recall:  Good  Fund of Knowledge:Good  Language: Good  Akathisia:  No  Handed:  Right  AIMS (if indicated):  not done  Assets:  Housing Leisure Time Physical Health Resilience Social Support  ADL's:  Intact  Cognition: WNL  Sleep:  Fair   Screenings: GAD-7     Video Visit from 09/14/2020 in [facility] Office Visit from 07/13/2020 in [facility]  Total GAD-7 Score 8 18    PHQ2-9     Video Visit from 09/14/2020 in [facility] Video Visit from 08/24/2020 in [facility] Office Visit from 07/13/2020 in [facility]  PHQ-2 Total Score 2 6 4   PHQ-9 Total Score 7 19 22       Assessment and Plan:  Major depressive disorder, single episode, moderate: -Recommended Zoloft 25 mg daily and increase to 50 mg daily within 3 days, client still evaluating -Follow up in 3 months  Virtual Visit via Video Note  I connected with on 09/21/20 at  3:00 PM EST by a video enabled telemedicine application and verified that I am speaking with the correct person using two identifiers.  Location: Patient: Home Provider: Home   I discussed the limitations of evaluation and management by telemedicine and the availability of in person appointments. The patient expressed understanding and agreed to proceed.  Follow Up Instructions: Follow up in 3 months   I discussed the assessment and treatment plan with the patient. The patient was provided an opportunity to ask questions and all were answered. The patient agreed with the plan and demonstrated  an understanding of the instructions.   The patient was advised to call back or seek an in-person evaluation if the symptoms worsen or if the condition fails to improve as anticipated.  I provided 20 minutes of non-face-to-face time during this encounter.   Nanine Means, NP   Nanine Means, NP 11/30/20213:04 PM

## 2020-09-22 DIAGNOSIS — Z419 Encounter for procedure for purposes other than remedying health state, unspecified: Secondary | ICD-10-CM | POA: Diagnosis not present

## 2020-09-24 ENCOUNTER — Ambulatory Visit (INDEPENDENT_AMBULATORY_CARE_PROVIDER_SITE_OTHER): Payer: Medicaid Other | Admitting: Licensed Clinical Social Worker

## 2020-09-24 ENCOUNTER — Other Ambulatory Visit: Payer: Self-pay

## 2020-09-24 DIAGNOSIS — O99345 Other mental disorders complicating the puerperium: Secondary | ICD-10-CM | POA: Diagnosis not present

## 2020-09-24 DIAGNOSIS — F339 Major depressive disorder, recurrent, unspecified: Secondary | ICD-10-CM

## 2020-09-27 NOTE — Progress Notes (Signed)
   THERAPIST PROGRESS NOTE   Virtual Visit via Video Note  I connected with Jacqueline Peck on 09/27/20 at  8:00 AM EST by a video enabled telemedicine application and verified that I am speaking with the correct person using two identifiers.  Location: Patient: Home Provider: [facility]   I discussed the limitations of evaluation and management by telemedicine and the availability of in person appointments. The patient expressed understanding and agreed to proceed. I discussed the assessment and treatment plan with the patient. The patient was provided an opportunity to ask questions and all were answered. The patient agreed with the plan and demonstrated an understanding of the instructions.   I provided 45 minutes of non-face-to-face time during this encounter.  Participation Level: Active  Behavioral Response: CasualAlertDepressed  Type of Therapy: Individual Therapy  Treatment Goals addressed: Communication: Depression/Coping  Interventions: Motivational Interviewing, Solution Focused and Supportive  Summary: Jacqueline Peck is a 24 y.o. female who presents with hx MDD. This date pt signs on for video session per her preference. Pt states she is doing "okay" when asked. This if first f/u since initial assessment. Pt endorses ongoing depressive symptoms yet believes she is doing slightly better on self care and routine ADLs. Pt reports she got her wisdom teeth out on Oct 13 which has alleviated some chronic pain she was dealing with. This has resulted in an improvement in coping. Pt , spouse and child are getting out of the home several times per wk by going to her mil for dinner. LCSW assessed for relationship stressors. Pt states her spouse is "not good at communication". Pt states she and spouse got into "a really bad argument" recently. With exploration pt reveals spouse is upset with her for not showing him the same level of affection he was accustomed to prior to birth of child. Spouse, Jacqueline Peck,   accused pt of cheating on him saying she must be giving her affection elsewhere since he is not getting it. Assisted pt to process and problem solve situation. Pt will get info on postpartum depression from credible website for husband to see common symptoms. LCSW assessed for further thoughts on antidepressant which was addressed in detail. Pt continues to breast feed her almost one yr old because she states she does not know how to "break her from it". Pt will see if pediatrician has a nurse educator to help and also look at credible online info. Pt very much wants to start Zoloft, which has helped her in the past, and will also get pediatrician's views on impacts for child if she starts med before she stops breast feeding. LCSW reviewed poc and coping strategies prior to close of session. Pt states appreciation for care.    Suicidal/Homicidal: Nowithout intent/plan  Therapist Response: Pt remains receptive to care.  Plan: Return again in 2 weeks.  Diagnosis: Axis I: MDD, postpartum onset    Axis II: Deferred  Gorman Sink, LCSW 09/27/2020

## 2020-10-08 ENCOUNTER — Ambulatory Visit (INDEPENDENT_AMBULATORY_CARE_PROVIDER_SITE_OTHER): Payer: Medicaid Other | Admitting: Licensed Clinical Social Worker

## 2020-10-08 ENCOUNTER — Other Ambulatory Visit: Payer: Self-pay

## 2020-10-08 DIAGNOSIS — F339 Major depressive disorder, recurrent, unspecified: Secondary | ICD-10-CM

## 2020-10-08 DIAGNOSIS — O99345 Other mental disorders complicating the puerperium: Secondary | ICD-10-CM | POA: Diagnosis not present

## 2020-10-11 NOTE — Progress Notes (Signed)
   THERAPIST PROGRESS NOTE   Virtual Visit via Video Note  I connected with Jacqueline Peck on 10/08/20 at 11:00 AM EST by a video enabled telemedicine application and verified that I am speaking with the correct person using two identifiers.  Location: Patient: Home Provider: [facility]   I discussed the limitations of evaluation and management by telemedicine and the availability of in person appointments. The patient expressed understanding and agreed to proceed. I discussed the assessment and treatment plan with the patient. The patient was provided an opportunity to ask questions and all were answered. The patient agreed with the plan and demonstrated an understanding of the instructions.   I provided 25 minutes of non-face-to-face time during this encounter.  Participation Level: Active  Behavioral Response: CasualAlertEuthymic  Type of Therapy: Individual Therapy  Treatment Goals addressed: Communication: Depression/Coping  Interventions: Motivational Interviewing and Supportive  Summary: Jacqueline Peck is a 24 y.o. female who presents with hx of MDD with post partum onset. This date pt signs on for in person session per her preference. Pt presents in a more uplifted mood. She states she has been so busy lately she has not been noticing many symptoms of depression. Pt reports her spouse has been working out of town some so she has more to do. Also states they have been busy preparing for the Christmas holiday. Pt states her spouse has done way more this year r/t decorating with their dtr getting older. This will be dtr's second Christmas. LCSW assessed for any stressors r/t holidays and fam traditions. Pt states she is "looking forward" to the season. She reports all the plans they have to be with both sides of the family different days. Pt advises on her side of the fam they will gather at her sister's home and there will a total of 8 children present. LCSW assessed for pt's success in finding  literature r/t post partum depression and weaning from breast feeding. Pt states she did not find a lot she thought would be helpful. She reports no detailed discussion with spouse as yet and they are getting along better. LCSW assessed for status of being able to start antidepressant. Pt advises she missed the call back from her pediatrician re med and breast feeding. She states she will try them back today. LCSW assessed for other worries/concerns. Pt states her dtr fell and chipped her front teeth recently. She advises she has already had her to the dentist and there will be no necessary tx at this time. She denies other worries/concerns. LCSW reiewed poc prior to close of session. Pt states appreciation for care.   Suicidal/Homicidal: Nowithout intent/plan  Therapist Response: Pt remains receptive to care.  Plan: Return again in ~4 weeks.  Diagnosis: Axis I: MDD    Axis II: Deferred  [place] Sink, LCSW 10/11/2020

## 2020-10-23 DIAGNOSIS — Z419 Encounter for procedure for purposes other than remedying health state, unspecified: Secondary | ICD-10-CM | POA: Diagnosis not present

## 2020-11-19 ENCOUNTER — Ambulatory Visit (HOSPITAL_COMMUNITY): Payer: Medicaid Other | Admitting: Licensed Clinical Social Worker

## 2020-11-23 DIAGNOSIS — Z419 Encounter for procedure for purposes other than remedying health state, unspecified: Secondary | ICD-10-CM | POA: Diagnosis not present

## 2020-12-06 ENCOUNTER — Ambulatory Visit (HOSPITAL_COMMUNITY): Payer: Medicaid Other | Admitting: Licensed Clinical Social Worker

## 2020-12-13 ENCOUNTER — Other Ambulatory Visit: Payer: Self-pay

## 2020-12-13 ENCOUNTER — Encounter (HOSPITAL_COMMUNITY): Payer: Self-pay

## 2020-12-13 ENCOUNTER — Telehealth (INDEPENDENT_AMBULATORY_CARE_PROVIDER_SITE_OTHER): Payer: Medicaid Other | Admitting: Psychiatry

## 2020-12-13 DIAGNOSIS — F331 Major depressive disorder, recurrent, moderate: Secondary | ICD-10-CM

## 2020-12-13 MED ORDER — GABAPENTIN 100 MG PO CAPS: 100.0000 mg | ORAL_CAPSULE | Freq: Three times a day (TID) | ORAL | 2 refills | Status: DC

## 2020-12-13 MED ORDER — GABAPENTIN 100 MG PO CAPS: 100.0000 mg | ORAL_CAPSULE | Freq: Three times a day (TID) | ORAL | 2 refills | Status: DC | PRN

## 2020-12-13 NOTE — Progress Notes (Signed)
Psychiatric Initial Adult Assessment   Patient Identification: Jacqueline Peck MRN:  [id number] Date of Evaluation:  12/13/2020 Referral Source: self Chief Complaint:  Anxiety, depression  Visit Diagnosis:  Major depressive disorder, recurrent, moderate  History of Present Illness:   25 year old Caucasian female presents for follow up with anxiety and depression.  "Not as bad", feels her anxiety is worse and triggers the depression which fluctuates with her anxiety.  5/10 depression, no suicidal ideations, "high" anxiety, no panic attacks.  Sleep if fair to poor based on anxiety.  Appetite is good, gained 5 lbs in the past three months.  She was considering Zoloft and decided she needed something PRN for anxiety, breastfeeding and wanted to evaluate medications.  Discussed options and safety, decided on gabapentin 100 mg TID PRN anxiety which she decrease her other symptoms.  Follow up in 2 weeks.  Associated Signs/Symptoms: Depression Symptoms:  depressed mood, insomnia, disturbed sleep, (Hypo) Manic Symptoms:  none Anxiety Symptoms:  none Psychotic Symptoms:  none PTSD Symptoms: NA  Past Psychiatric History: none  Previous Psychotropic Medications: No   Substance Abuse History in the last 12 months:  No.  Consequences of Substance Abuse: NA  Past Medical History: No past medical history on file.  Past Surgical History:  Procedure Laterality Date  . CHOLECYSTECTOMY    . kidney removed    . NEPHRECTOMY Right     Family Psychiatric History: none  Family History:  Family History  Family history unknown: Yes    Social History:   Social History   Socioeconomic History  . Marital status: Married    Spouse name: Not on file  . Number of children: 1  . Years of education: Not on file  . Highest education level: Not on file  Occupational History  . Not on file  Tobacco Use  . Smoking status: Never Smoker  . Smokeless tobacco: Never Used  Vaping Use  . Vaping Use:  Never used  Substance and Sexual Activity  . Alcohol use: Not Currently  . Drug use: Never  . Sexual activity: Yes    Birth control/protection: Condom  Other Topics Concern  . Not on file  Social History Narrative  . Not on file   Social Determinants of Health   Financial Resource Strain: Not on file  Food Insecurity: Not on file  Transportation Needs: Not on file  Physical Activity: Not on file  Stress: Not on file  Social Connections: Not on file    Additional Social History: lives with her husband of 42 years, one-year old baby  Allergies:  No Known Allergies  Metabolic Disorder Labs: No results found for: HGBA1C, MPG No results found for: PROLACTIN No results found for: CHOL, TRIG, HDL, CHOLHDL, VLDL, LDLCALC No results found for: TSH  Therapeutic Level Labs: No results found for: LITHIUM No results found for: CBMZ No results found for: VALPROATE  Current Medications: Current Outpatient Medications  Medication Sig Dispense Refill  . acetaminophen (TYLENOL) 325 MG tablet Take 650 mg by mouth every 6 (six) hours as needed.    . pantoprazole (PROTONIX) 20 MG tablet Take 1 tablet (20 mg total) by mouth daily. 30 tablet 1  . sertraline (ZOLOFT) 50 MG tablet Take 1 tablet (50 mg total) by mouth daily. 30 tablet 2   No current facility-administered medications for this visit.    Musculoskeletal: Strength & Muscle Tone: within normal limits Gait & Station: normal Patient leans: N/A  Psychiatric Specialty Exam: Review of Systems  Psychiatric/Behavioral: Positive for dysphoric mood and sleep disturbance. The patient is nervous/anxious.   All other systems reviewed and are negative.   unknown if currently breastfeeding.There is no height or weight on file to calculate BMI.  General Appearance: Casual  Eye Contact:  Good  Speech:  Clear and Coherent  Volume:  Normal  Mood:  Anxious and Depressed  Affect:  Congruent  Thought Process:  Coherent and Descriptions of  Associations: Intact  Orientation:  Full (Time, Place, and Person)  Thought Content:  WDL and Logical  Suicidal Thoughts:  No  Homicidal Thoughts:  No  Memory:  Immediate;   Good Recent;   Good Remote;   Good  Judgement:  Good  Insight:  Good  Psychomotor Activity:  Decreased  Concentration:  Concentration: Good and Attention Span: Good  Recall:  Good  Fund of Knowledge:Good  Language: Good  Akathisia:  No  Handed:  Right  AIMS (if indicated):  not done  Assets:  Housing Leisure Time Physical Health Resilience Social Support  ADL's:  Intact  Cognition: WNL  Sleep:  Fair   Screenings: GAD-7   Flowsheet Row Video Visit from 09/14/2020 in [facility] Office Visit from 07/13/2020 in [facility]  Total GAD-7 Score 8 18    PHQ2-9   Flowsheet Row Video Visit from 09/14/2020 in [facility] Video Visit from 08/24/2020 in [facility] Office Visit from 07/13/2020 in [facility]  PHQ-2 Total Score 2 6 4   PHQ-9 Total Score 7 19 22       Assessment and Plan:  Major depressive disorder, single episode, moderate: -Recommended Zoloft 25 mg daily and increase to 50 mg daily within 3 days, client would like a PRN medication for anxiety instead -Follow up in 1 month  Anxiety: -Started gabapentin 100 mg TID PRN  Virtual Visit via Video Note  I connected with on 12/13/20 at  3:00 PM EST by a video enabled telemedicine application and verified that I am speaking with the correct person using two identifiers.  Location: Patient: Home Provider: Home   I discussed the limitations of evaluation and management by telemedicine and the availability of in person appointments. The patient expressed understanding and agreed to proceed.  Follow Up Instructions: Follow up in 1 month   I discussed the assessment and treatment plan with the patient. The patient was  provided an opportunity to ask questions and all were answered. The patient agreed with the plan and demonstrated an understanding of the instructions.   The patient was advised to call back or seek an in-person evaluation if the symptoms worsen or if the condition fails to improve as anticipated.  I provided 20 minutes of non-face-to-face time during this encounter.   [name], NP   12/15/20, NP 2/21/20222:59 PM

## 2020-12-20 ENCOUNTER — Telehealth (HOSPITAL_COMMUNITY): Payer: Medicaid Other

## 2020-12-21 DIAGNOSIS — Z419 Encounter for procedure for purposes other than remedying health state, unspecified: Secondary | ICD-10-CM | POA: Diagnosis not present

## 2020-12-28 ENCOUNTER — Ambulatory Visit (INDEPENDENT_AMBULATORY_CARE_PROVIDER_SITE_OTHER): Payer: Medicaid Other | Admitting: Licensed Clinical Social Worker

## 2020-12-28 ENCOUNTER — Other Ambulatory Visit: Payer: Self-pay

## 2020-12-28 DIAGNOSIS — O99345 Other mental disorders complicating the puerperium: Secondary | ICD-10-CM

## 2020-12-28 DIAGNOSIS — F339 Major depressive disorder, recurrent, unspecified: Secondary | ICD-10-CM | POA: Diagnosis not present

## 2020-12-31 NOTE — Progress Notes (Signed)
   THERAPIST PROGRESS NOTE   Virtual Visit via Video Note  I connected with [name] on 12/28/20 at 10:00 AM EST by a video enabled telemedicine application and verified that I am speaking with the correct person using two identifiers.  Location: Patient: Home Provider: [facility]   I discussed the limitations of evaluation and management by telemedicine and the availability of in person appointments. The patient expressed understanding and agreed to proceed. I discussed the assessment and treatment plan with the patient. The patient was provided an opportunity to ask questions and all were answered. The patient agreed with the plan and demonstrated an understanding of the instructions.  I provided 25 minutes of non-face-to-face time during this encounter.  Participation Level: Active  Behavioral Response: CasualAlertAnxious and Depressed  Type of Therapy: Individual Therapy  Treatment Goals addressed: Communication: dep/anx/coping  Interventions: Supportive  Summary: Jacqueline Peck is a 25 y.o. female who presents with hx of MDD. This date pt signs on for video session per her prefercne. Pt last seen 10/08/21. LCSW assessed for overall status and any significant changes. Pt reports she is currently tired and just woke up. She reports problems with sleep stating she could not fall asleep last night and thinks it was around 2am before she did get to sleep. She had no electronics on. Pt reports she is not talking Gabapentin. She states she took it one time and it made her feel "drunk". Pt does not have med management appt until 01/24/21. She agrees to call to report her experience with Gabapentin to see if there may be an adjustment post session. Pt reports her dep and anx are both "6 or 7" on a scale of 0-10. LCSW assessed for status of relationship with spouse. She states he is working many hours and they do not have that much time together. She states if they get into a disagreement it is about  lack of time together. Pt states in addition to husband's FT job he is also working with a friend PT, which is new. Pt reports her dtr is doing well. Pt is continuing to breast feed but states it is less. Referred to [facility] website for weaning tips. Remainder of session spent addressing coping strategies. Pt has never practiced meditation. LCSW provided education and referred to Daily Calm, which pt agrees to explore/use. Reviewed poc including scheduling prior to close of session. Pt states appreciation for care.     Suicidal/Homicidal: Nowithout intent/plan  Therapist Response: Pt remains receptive to care.  Plan: Return again in  weeks.  Diagnosis: Axis I: MDD    Axis II: Deferred  Neeses Sink, LCSW 12/31/2020

## 2021-01-21 DIAGNOSIS — Z419 Encounter for procedure for purposes other than remedying health state, unspecified: Secondary | ICD-10-CM | POA: Diagnosis not present

## 2021-01-24 ENCOUNTER — Other Ambulatory Visit: Payer: Self-pay

## 2021-01-24 ENCOUNTER — Telehealth (HOSPITAL_COMMUNITY): Payer: Medicaid Other

## 2021-02-20 DIAGNOSIS — Z419 Encounter for procedure for purposes other than remedying health state, unspecified: Secondary | ICD-10-CM | POA: Diagnosis not present

## 2021-03-23 DIAGNOSIS — Z419 Encounter for procedure for purposes other than remedying health state, unspecified: Secondary | ICD-10-CM | POA: Diagnosis not present

## 2021-04-22 DIAGNOSIS — Z419 Encounter for procedure for purposes other than remedying health state, unspecified: Secondary | ICD-10-CM | POA: Diagnosis not present

## 2021-05-23 DIAGNOSIS — Z419 Encounter for procedure for purposes other than remedying health state, unspecified: Secondary | ICD-10-CM | POA: Diagnosis not present

## 2021-05-26 ENCOUNTER — Ambulatory Visit: Payer: Medicaid Other | Admitting: Nurse Practitioner

## 2021-06-23 DIAGNOSIS — Z419 Encounter for procedure for purposes other than remedying health state, unspecified: Secondary | ICD-10-CM | POA: Diagnosis not present

## 2021-07-23 DIAGNOSIS — Z419 Encounter for procedure for purposes other than remedying health state, unspecified: Secondary | ICD-10-CM | POA: Diagnosis not present

## 2021-08-23 DIAGNOSIS — Z419 Encounter for procedure for purposes other than remedying health state, unspecified: Secondary | ICD-10-CM | POA: Diagnosis not present

## 2021-09-21 DIAGNOSIS — J101 Influenza due to other identified influenza virus with other respiratory manifestations: Secondary | ICD-10-CM | POA: Diagnosis not present

## 2021-09-21 DIAGNOSIS — Z20822 Contact with and (suspected) exposure to covid-19: Secondary | ICD-10-CM | POA: Diagnosis not present

## 2021-09-22 ENCOUNTER — Telehealth: Payer: Self-pay

## 2021-09-22 DIAGNOSIS — Z419 Encounter for procedure for purposes other than remedying health state, unspecified: Secondary | ICD-10-CM | POA: Diagnosis not present

## 2021-09-22 NOTE — Telephone Encounter (Signed)
Transition Care Management Unsuccessful Follow-up Telephone Call  Date of discharge and from where:  09/21/2021-[facility]  Attempts:  1st Attempt  Reason for unsuccessful TCM follow-up call:  Left voice message

## 2021-09-22 NOTE — Telephone Encounter (Signed)
Error

## 2021-09-23 NOTE — Telephone Encounter (Signed)
error 

## 2021-09-23 NOTE — Telephone Encounter (Signed)
Transition Care Management Unsuccessful Follow-up Telephone Call  Date of discharge and from where:  09/21/2021-[facility]  Attempts:  2nd Attempt  Reason for unsuccessful TCM follow-up call:  Left voice message

## 2021-09-26 NOTE — Telephone Encounter (Signed)
Transition Care Management Unsuccessful Follow-up Telephone Call  Date of discharge and from where:  09/21/2021 from [facility]  Attempts:  3rd Attempt  Reason for unsuccessful TCM follow-up call:  Unable to reach patient

## 2021-10-23 DIAGNOSIS — Z419 Encounter for procedure for purposes other than remedying health state, unspecified: Secondary | ICD-10-CM | POA: Diagnosis not present

## 2021-11-23 DIAGNOSIS — Z419 Encounter for procedure for purposes other than remedying health state, unspecified: Secondary | ICD-10-CM | POA: Diagnosis not present

## 2021-12-21 DIAGNOSIS — Z419 Encounter for procedure for purposes other than remedying health state, unspecified: Secondary | ICD-10-CM | POA: Diagnosis not present

## 2022-01-21 DIAGNOSIS — Z419 Encounter for procedure for purposes other than remedying health state, unspecified: Secondary | ICD-10-CM | POA: Diagnosis not present

## 2022-02-20 DIAGNOSIS — Z419 Encounter for procedure for purposes other than remedying health state, unspecified: Secondary | ICD-10-CM | POA: Diagnosis not present

## 2022-02-27 ENCOUNTER — Ambulatory Visit: Payer: Medicaid Other | Admitting: Family Medicine

## 2022-02-28 ENCOUNTER — Ambulatory Visit (INDEPENDENT_AMBULATORY_CARE_PROVIDER_SITE_OTHER): Payer: Medicaid Other | Admitting: Nurse Practitioner

## 2022-02-28 ENCOUNTER — Encounter: Payer: Self-pay | Admitting: Nurse Practitioner

## 2022-02-28 VITALS — BP 124/78 | HR 89 | Temp 98.3°F | Wt 226.0 lb

## 2022-02-28 DIAGNOSIS — K219 Gastro-esophageal reflux disease without esophagitis: Secondary | ICD-10-CM | POA: Diagnosis not present

## 2022-02-28 DIAGNOSIS — F321 Major depressive disorder, single episode, moderate: Secondary | ICD-10-CM | POA: Diagnosis not present

## 2022-02-28 DIAGNOSIS — N926 Irregular menstruation, unspecified: Secondary | ICD-10-CM | POA: Insufficient documentation

## 2022-02-28 DIAGNOSIS — N912 Amenorrhea, unspecified: Secondary | ICD-10-CM | POA: Insufficient documentation

## 2022-02-28 DIAGNOSIS — N911 Secondary amenorrhea: Secondary | ICD-10-CM | POA: Insufficient documentation

## 2022-02-28 LAB — COMPREHENSIVE METABOLIC PANEL
ALT: 21 U/L (ref 0–35)
AST: 16 U/L (ref 0–37)
Albumin: 4.2 g/dL (ref 3.5–5.2)
Alkaline Phosphatase: 95 U/L (ref 39–117)
BUN: 12 mg/dL (ref 6–23)
CO2: 24 mEq/L (ref 19–32)
Calcium: 9.3 mg/dL (ref 8.4–10.5)
Chloride: 103 mEq/L (ref 96–112)
Creatinine, Ser: 0.77 mg/dL (ref 0.40–1.20)
GFR: 106.09 mL/min (ref >60.00)
Glucose, Bld: 95 mg/dL (ref 70–99)
Potassium: 4.1 mEq/L (ref 3.5–5.1)
Sodium: 136 mEq/L (ref 135–145)
Total Bilirubin: 0.5 mg/dL (ref 0.2–1.2)
Total Protein: 7.4 g/dL (ref 6.0–8.3)

## 2022-02-28 LAB — CBC
HCT: 41.7 % (ref 36.0–46.0)
Hemoglobin: 13.6 g/dL (ref 12.0–15.0)
MCHC: 32.6 g/dL (ref 30.0–36.0)
MCV: 84.6 fl (ref 78.0–100.0)
Platelets: 259 10*3/uL (ref 150.0–400.0)
RBC: 4.93 Mil/uL (ref 3.87–5.11)
RDW: 14.8 % (ref 11.5–15.5)
WBC: 9.2 10*3/uL (ref 4.0–10.5)

## 2022-02-28 LAB — FOLLICLE STIMULATING HORMONE: FSH: 2.2 m[IU]/mL

## 2022-02-28 LAB — TSH: TSH: 3.13 u[IU]/mL (ref 0.35–5.50)

## 2022-02-28 LAB — POCT URINE PREGNANCY: Preg Test, Ur: NEGATIVE

## 2022-02-28 LAB — TESTOSTERONE: Testosterone: 52.83 ng/dL — ABNORMAL HIGH (ref 15.00–40.00)

## 2022-02-28 MED ORDER — PANTOPRAZOLE SODIUM 20 MG PO TBEC: 20.0000 mg | DELAYED_RELEASE_TABLET | Freq: Every day | ORAL | 0 refills | Status: DC

## 2022-02-28 NOTE — Patient Instructions (Addendum)
Negative urine pregnancy ?Go to lab. ? ? ?

## 2022-02-28 NOTE — Progress Notes (Signed)
? ?             Established Patient Visit ? ?Patient: Jacqueline Peck   DOB: [birth year]   25 y.o. Female  MRN: [id number] ?Visit Date: 02/28/2022 ? ?Subjective:  ?  ?Chief Complaint  ?Patient presents with  ? Irregular periods  ?  Patient states that she has had irregular periods x 6 months. Denies any abdominal pain. Patient sees Dr. Harrington Challenger at [facility]. Referral to therapist. Refill pantoprazole.   ? ?HPI ?Major depressive disorder, single episode, moderate ([place]) ?Worsening depression and anxiety ?No SI/HI ?Declined medication at this time ?Requested referral to psychiatry and psychology ? ?Amenorrhea ?Hx of amenorrhea before birth of 12yrs old daughter. Absence of menstrual cycle 3-56months is past. ?Today she states she had a cycle had cycles after birth of her daughter, but has not had one in last 62months. LMP 07/2021. She is currently breastfeeding her 6yrs old daughter. She is sexually active with use of condoms. ? ?PCOS vs breastfeeding? ?Urine pregnancy is negative today. ?Check FSH, TSH, CBC, CMP, testosterone, estradiol, prolactin. ?Consider progesterone challenge if normal labs.  ? ?Wt Readings from Last 3 Encounters:  ?02/28/22 226 lb (102.5 kg)  ?09/14/20 193 lb (87.5 kg)  ?07/13/20 195 lb 9.6 oz (88.7 kg)  ?  ? ?  02/28/2022  ?  1:13 PM 12/13/2020  ?  3:14 PM 09/14/2020  ?  1:15 PM  ?Depression screen PHQ 2/9  ?Decreased Interest 3 0 1  ?Down, Depressed, Hopeless 3 1 1   ?PHQ - 2 Score 6 1 2   ?Altered sleeping 3  1  ?Tired, decreased energy 3  1  ?Change in appetite 2  1  ?Feeling bad or failure about yourself  3  1  ?Trouble concentrating 3  1  ?Moving slowly or fidgety/restless 2  0  ?Suicidal thoughts 0  0  ?PHQ-9 Score 22  7  ?Difficult doing work/chores Extremely dIfficult  Somewhat difficult  ?  ? ?  02/28/2022  ?  1:13 PM 09/14/2020  ?  1:18 PM 07/13/2020  ?  9:54 AM  ?GAD 7 : Generalized Anxiety Score  ?Nervous, Anxious, on Edge 3 1 3   ?Control/stop worrying 3 1 3   ?Worry too much - different things 3 1  3   ?Trouble relaxing 3 1 2   ?Restless 2 2 1   ?Easily annoyed or irritable 3 1 3   ?Afraid - awful might happen 2 1 3   ?Total GAD 7 Score 19 8 18   ?Anxiety Difficulty Extremely difficult Extremely difficult Extremely difficult  ? ?Major depressive disorder, single episode, moderate ([place]) ?Worsening depression and anxiety ?No SI/HI ?Declined medication at this time ?Requested referral to psychiatry and psychology ? ?Amenorrhea ?Hx of amenorrhea before birth of 25yrs old daughter. Absence of menstrual cycle 3-54months is past. ?Today she states she had a cycle had cycles after birth of her daughter, but has not had one in last 70months. LMP 07/2021. She is currently breastfeeding her 61yrs old daughter. She is sexually active with use of condoms. ? ?PCOS vs breastfeeding? ?Urine pregnancy is negative today. ?Check FSH, TSH, CBC, CMP, testosterone, estradiol, prolactin. ?Consider progesterone challenge if normal labs. ? ? ?Reviewed medical, surgical, and social history today ? ?Medications: ?Outpatient Medications Prior to Visit  ?Medication Sig  ? acetaminophen (TYLENOL) 325 MG tablet Take 650 mg by mouth every 6 (six) hours as needed.  ? [DISCONTINUED] pantoprazole (PROTONIX) 20 MG tablet Take 1 tablet (20 mg total) by mouth daily.  ? gabapentin (  NEURONTIN) 100 MG capsule Take 1 capsule (100 mg total) by mouth 3 (three) times daily as needed.  ? ?No facility-administered medications prior to visit.  ? ?Reviewed past medical and social history.  ? ?ROS per HPI above ? ? ?   ?Objective:  ?BP 124/78 (BP Location: Left Arm, Patient Position: Sitting, Cuff Size: Large)   Pulse 89   Temp 98.3 ?F (36.8 ?C) (Temporal)   Wt 226 lb (102.5 kg)   LMP 08/24/2021   SpO2 100%   BMI 40.03 kg/m?  ? ?  ? ?Physical Exam  ?Results for orders placed or performed in visit on 02/28/22  ?POCT urine pregnancy  ?Result Value Ref Range  ? Preg Test, Ur Negative Negative  ? ?   ?Assessment & Plan:  ?  ?Problem List Items Addressed This Visit    ? ?  ? Digestive  ? Gastroesophageal reflux disease without esophagitis - Primary  ? Relevant Medications  ? pantoprazole (PROTONIX) 20 MG tablet  ?  ? Other  ? Amenorrhea  ?  Hx of amenorrhea before birth of 70yrs old daughter. Absence of menstrual cycle 3-70months is past. ?Today she states she had a cycle had cycles after birth of her daughter, but has not had one in last 52months. LMP 07/2021. She is currently breastfeeding her 36yrs old daughter. She is sexually active with use of condoms. ? ?PCOS vs breastfeeding? ?Urine pregnancy is negative today. ?Check FSH, TSH, CBC, CMP, testosterone, estradiol, prolactin. ?Consider progesterone challenge if normal labs. ? ?  ?  ? Relevant Orders  ? POCT urine pregnancy (Completed)  ? TSH  ? Comprehensive metabolic panel  ? CBC  ? Testosterone  ? [place]  ? Estradiol  ? Prolactin  ? Major depressive disorder, single episode, moderate ([place])  ?  Worsening depression and anxiety ?No SI/HI ?Declined medication at this time ?Requested referral to psychiatry and psychology ? ?  ?  ? Relevant Orders  ? Ambulatory referral to Psychiatry  ? Ambulatory referral to Psychology  ? ?Return if symptoms worsen or fail to improve. ? ?  ? ?Wilfred Lacy, NP ? ? ?

## 2022-02-28 NOTE — Assessment & Plan Note (Addendum)
Hx of amenorrhea before birth of 50yrs old daughter. Absence of menstrual cycle 3-75months is past. ?Today she states she had a cycle had cycles after birth of her daughter, but has not had one in last 28months. LMP 07/2021. She is currently breastfeeding her 89yrs old daughter. She is sexually active with use of condoms. ? ?PCOS vs breastfeeding? ?Urine pregnancy is negative today. ?Check FSH, TSH, CBC, CMP, testosterone, estradiol, prolactin. ?Consider progesterone challenge if normal labs. ?

## 2022-02-28 NOTE — Assessment & Plan Note (Signed)
Worsening depression and anxiety ?No SI/HI ?Declined medication at this time ?Requested referral to psychiatry and psychology ?

## 2022-03-01 LAB — ESTRADIOL: Estradiol: 103 pg/mL

## 2022-03-01 LAB — PROLACTIN: Prolactin: 12.8 ng/mL

## 2022-03-06 ENCOUNTER — Encounter: Payer: Self-pay | Admitting: Nurse Practitioner

## 2022-03-13 DIAGNOSIS — Z79899 Other long term (current) drug therapy: Secondary | ICD-10-CM | POA: Diagnosis not present

## 2022-03-13 DIAGNOSIS — F411 Generalized anxiety disorder: Secondary | ICD-10-CM | POA: Diagnosis not present

## 2022-03-13 DIAGNOSIS — F330 Major depressive disorder, recurrent, mild: Secondary | ICD-10-CM | POA: Diagnosis not present

## 2022-03-23 DIAGNOSIS — Z419 Encounter for procedure for purposes other than remedying health state, unspecified: Secondary | ICD-10-CM | POA: Diagnosis not present

## 2022-04-13 ENCOUNTER — Ambulatory Visit (INDEPENDENT_AMBULATORY_CARE_PROVIDER_SITE_OTHER): Payer: Medicaid Other | Admitting: Nurse Practitioner

## 2022-04-13 ENCOUNTER — Encounter: Payer: Self-pay | Admitting: Nurse Practitioner

## 2022-04-13 VITALS — BP 126/86 | HR 67 | Temp 97.9°F | Ht 63.0 in | Wt 231.0 lb

## 2022-04-13 DIAGNOSIS — R5382 Chronic fatigue, unspecified: Secondary | ICD-10-CM

## 2022-04-13 DIAGNOSIS — N926 Irregular menstruation, unspecified: Secondary | ICD-10-CM

## 2022-04-13 DIAGNOSIS — F321 Major depressive disorder, single episode, moderate: Secondary | ICD-10-CM | POA: Diagnosis not present

## 2022-04-13 LAB — POCT URINE PREGNANCY: Preg Test, Ur: NEGATIVE

## 2022-04-13 MED ORDER — NORETHINDRONE 0.35 MG PO TABS: 1.0000 | ORAL_TABLET | Freq: Every day | ORAL | 3 refills | Status: DC

## 2022-04-13 NOTE — Progress Notes (Signed)
                Established Patient Visit  Patient: Jacqueline Peck   DOB: September 16, 1996   26 y.o. Female  MRN: [id number] Visit Date: 04/13/2022  Subjective:    Chief Complaint  Patient presents with   Acute Visit    Needing labs for therapist Concerns of missed periods each month, last cycle 03/04/22-03/11/22 No other concerns Requested records for Pap   HPI Irregular menstrual cycle LMP 03/04/22-03/11/22 Still breastfeeding Negative urine pregnancy to day Normal prolactin, estradiol, FSH, TSH, CBC, CMP mildly elevated testosterone: need to repeat in AM  Agreed to start oral progesterone F/up in 62months   She is requesting for B12 and Vit. D check per [facility] due to chronic fatigue.  Reviewed medical, surgical, and social history today  Medications: Outpatient Medications Prior to Visit  Medication Sig   acetaminophen (TYLENOL) 325 MG tablet Take 650 mg by mouth every 6 (six) hours as needed.   pantoprazole (PROTONIX) 20 MG tablet Take 1 tablet (20 mg total) by mouth daily.   sertraline (ZOLOFT) 50 MG tablet Take 50 mg by mouth every morning. (Patient not taking: Reported on 04/13/2022)   [DISCONTINUED] gabapentin (NEURONTIN) 100 MG capsule Take 1 capsule (100 mg total) by mouth 3 (three) times daily as needed.   No facility-administered medications prior to visit.   Reviewed past medical and social history.   ROS per HPI above      Objective:  BP 126/86 (BP Location: Right Arm, Patient Position: Sitting, Cuff Size: Normal)   Pulse 67   Temp 97.9 F (36.6 C) (Temporal)   Ht 5\' 3"  (1.6 m)   Wt 231 lb (104.8 kg)   SpO2 97%   BMI 40.92 kg/m      Physical Exam Cardiovascular:     Rate and Rhythm: Normal rate.     Pulses: Normal pulses.  Pulmonary:     Effort: Pulmonary effort is normal.  Neurological:     Mental Status: She is alert and oriented to person, place, and time.  Psychiatric:        Mood and Affect: Mood normal.        Behavior:  Behavior normal.        Thought Content: Thought content normal.     Results for orders placed or performed in visit on 04/13/22  POCT urine pregnancy  Result Value Ref Range   Preg Test, Ur Negative Negative      Assessment & Plan:    Problem List Items Addressed This Visit       Other   Irregular menstrual cycle    LMP 03/04/22-03/11/22 Still breastfeeding Negative urine pregnancy to day Normal prolactin, estradiol, FSH, TSH, CBC, CMP mildly elevated testosterone: need to repeat in AM  Agreed to start oral progesterone F/up in 48months      Relevant Medications   norethindrone (MICRONOR) 0.35 MG tablet   Other Relevant Orders   POCT urine pregnancy (Completed)   Major depressive disorder, single episode, moderate ([facility]) - Primary   Relevant Medications   sertraline (ZOLOFT) 50 MG tablet   Other Visit Diagnoses     Chronic fatigue       Relevant Orders   B12   Vitamin D (25 hydroxy)      Return in about 3 months (around 07/14/2022) for irregular menstrual cycle.     07/16/2022, NP

## 2022-04-13 NOTE — Patient Instructions (Signed)
Go to lab Will send Jacqueline Peck (oral progesterone to regulate menstrual cycle). F/up in 70months

## 2022-04-13 NOTE — Assessment & Plan Note (Signed)
LMP 03/04/22-03/11/22 Still breastfeeding Negative urine pregnancy to day Normal prolactin, estradiol, FSH, TSH, CBC, CMP mildly elevated testosterone: need to repeat in AM  Agreed to start oral progesterone F/up in 93months

## 2022-04-14 LAB — VITAMIN B12: Vitamin B-12: 339 pg/mL (ref 211–911)

## 2022-04-14 LAB — VITAMIN D 25 HYDROXY (VIT D DEFICIENCY, FRACTURES): VITD: 31.52 ng/mL (ref 30.00–100.00)

## 2022-04-19 DIAGNOSIS — F330 Major depressive disorder, recurrent, mild: Secondary | ICD-10-CM | POA: Diagnosis not present

## 2022-04-19 DIAGNOSIS — F411 Generalized anxiety disorder: Secondary | ICD-10-CM | POA: Diagnosis not present

## 2022-04-22 DIAGNOSIS — Z419 Encounter for procedure for purposes other than remedying health state, unspecified: Secondary | ICD-10-CM | POA: Diagnosis not present

## 2022-04-28 DIAGNOSIS — F411 Generalized anxiety disorder: Secondary | ICD-10-CM | POA: Diagnosis not present

## 2022-04-28 DIAGNOSIS — F330 Major depressive disorder, recurrent, mild: Secondary | ICD-10-CM | POA: Diagnosis not present

## 2022-05-19 DIAGNOSIS — F330 Major depressive disorder, recurrent, mild: Secondary | ICD-10-CM | POA: Diagnosis not present

## 2022-05-19 DIAGNOSIS — F411 Generalized anxiety disorder: Secondary | ICD-10-CM | POA: Diagnosis not present

## 2022-05-23 DIAGNOSIS — Z419 Encounter for procedure for purposes other than remedying health state, unspecified: Secondary | ICD-10-CM | POA: Diagnosis not present

## 2022-05-25 ENCOUNTER — Other Ambulatory Visit: Payer: Self-pay | Admitting: Nurse Practitioner

## 2022-05-25 DIAGNOSIS — K219 Gastro-esophageal reflux disease without esophagitis: Secondary | ICD-10-CM

## 2022-05-25 NOTE — Telephone Encounter (Signed)
Chart supports Rx Last OV: 03/2022 Next OV: 06/2022  

## 2022-05-31 DIAGNOSIS — F330 Major depressive disorder, recurrent, mild: Secondary | ICD-10-CM | POA: Diagnosis not present

## 2022-05-31 DIAGNOSIS — F411 Generalized anxiety disorder: Secondary | ICD-10-CM | POA: Diagnosis not present

## 2022-06-13 DIAGNOSIS — F411 Generalized anxiety disorder: Secondary | ICD-10-CM | POA: Diagnosis not present

## 2022-06-13 DIAGNOSIS — F330 Major depressive disorder, recurrent, mild: Secondary | ICD-10-CM | POA: Diagnosis not present

## 2022-06-20 DIAGNOSIS — F411 Generalized anxiety disorder: Secondary | ICD-10-CM | POA: Diagnosis not present

## 2022-06-20 DIAGNOSIS — F330 Major depressive disorder, recurrent, mild: Secondary | ICD-10-CM | POA: Diagnosis not present

## 2022-06-23 DIAGNOSIS — Z419 Encounter for procedure for purposes other than remedying health state, unspecified: Secondary | ICD-10-CM | POA: Diagnosis not present

## 2022-07-10 DIAGNOSIS — F330 Major depressive disorder, recurrent, mild: Secondary | ICD-10-CM | POA: Diagnosis not present

## 2022-07-10 DIAGNOSIS — F411 Generalized anxiety disorder: Secondary | ICD-10-CM | POA: Diagnosis not present

## 2022-07-14 ENCOUNTER — Encounter: Payer: Self-pay | Admitting: Nurse Practitioner

## 2022-07-14 ENCOUNTER — Ambulatory Visit (INDEPENDENT_AMBULATORY_CARE_PROVIDER_SITE_OTHER): Payer: Medicaid Other | Admitting: Nurse Practitioner

## 2022-07-14 VITALS — BP 118/80 | HR 66 | Temp 97.8°F | Ht 63.0 in | Wt 230.2 lb

## 2022-07-14 DIAGNOSIS — M5441 Lumbago with sciatica, right side: Secondary | ICD-10-CM | POA: Diagnosis not present

## 2022-07-14 DIAGNOSIS — R7303 Prediabetes: Secondary | ICD-10-CM | POA: Insufficient documentation

## 2022-07-14 DIAGNOSIS — N912 Amenorrhea, unspecified: Secondary | ICD-10-CM | POA: Diagnosis not present

## 2022-07-14 LAB — POCT GLYCOSYLATED HEMOGLOBIN (HGB A1C): Hemoglobin A1C: 5.8 % — AB (ref 4.0–5.6)

## 2022-07-14 LAB — POCT URINE PREGNANCY: Preg Test, Ur: NEGATIVE

## 2022-07-14 MED ORDER — IBUPROFEN 600 MG PO TABS: 600.0000 mg | ORAL_TABLET | Freq: Three times a day (TID) | ORAL | 0 refills | Status: DC | PRN

## 2022-07-14 NOTE — Progress Notes (Signed)
Established Patient Visit  Patient: Jacqueline Peck   DOB: Mar 06, 1996   26 y.o. Female  MRN: [id number] Visit Date: 07/14/2022  Subjective:    Chief Complaint  Patient presents with   Acute Visit    C/o irregular cycles Back pain x 2 weeks  No other concerns  LMP 03/04/22-03/11/22   Back Pain This is a new problem. The current episode started in the past 7 days. The problem occurs constantly. The problem is unchanged. The pain is present in the lumbar spine. The quality of the pain is described as aching. The pain radiates to the right thigh. The pain is The same all the time. The symptoms are aggravated by lying down and bending. Pertinent negatives include no abdominal pain, bladder incontinence, bowel incontinence, chest pain, dysuria, fever, headaches, leg pain, numbness, paresis, paresthesias, pelvic pain, perianal numbness, tingling, weakness or weight loss. Risk factors include obesity, poor posture, sedentary lifestyle and lack of exercise. She has tried analgesics for the symptoms. The treatment provided mild relief.   Amenorrhea, secondary Persistent amenorrhea since birth of 2.31yrs old daughter. She reports hx of irregular cycle to pregnancy 30yrs ago. She is still breastfeeding her 2.76yrs old daughter No vaginal bleeding with progesterone challenge Testosterone at 51 Normal prolactin, estradiol, FSH, TSH, CMP, CBC hgbA1c at 5.8% LMP 03/04/22-03/14/22 No acne, no hirsutism. Urine preg today: negative Entered ref to GYN Advised about need for weight loss  Prediabetes hgbA1c at 5.8% Entered referral to nutritionist Advised about need for lifestyle modification  Wt Readings from Last 3 Encounters:  07/14/22 230 lb 3.2 oz (104.4 kg)  04/13/22 231 lb (104.8 kg)  02/28/22 226 lb (102.5 kg)    Reviewed medical, surgical, and social history today  Medications: Outpatient Medications Prior to Visit  Medication Sig   acetaminophen (TYLENOL) 325 MG tablet Take 650  mg by mouth every 6 (six) hours as needed.   pantoprazole (PROTONIX) 20 MG tablet TAKE 1 TABLET BY MOUTH EVERY DAY   [DISCONTINUED] norethindrone (MICRONOR) 0.35 MG tablet Take 1 tablet (0.35 mg total) by mouth daily. (Patient not taking: Reported on 07/14/2022)   No facility-administered medications prior to visit.   Reviewed past medical and social history.   ROS per HPI above  Last CBC Lab Results  Component Value Date   WBC 9.2 02/28/2022   HGB 13.6 02/28/2022   HCT 41.7 02/28/2022   MCV 84.6 02/28/2022   MCH 26.2 03/03/2020   RDW 14.8 02/28/2022   PLT 259.0 [id number]   Last metabolic panel Lab Results  Component Value Date   GLUCOSE 95 02/28/2022   NA 136 02/28/2022   K 4.1 02/28/2022   CL 103 02/28/2022   CO2 24 02/28/2022   BUN 12 02/28/2022   CREATININE 0.77 02/28/2022   GFRNONAA >60 03/03/2020   CALCIUM 9.3 02/28/2022   PROT 7.4 02/28/2022   ALBUMIN 4.2 02/28/2022   BILITOT 0.5 02/28/2022   ALKPHOS 95 02/28/2022   AST 16 02/28/2022   ALT 21 02/28/2022   ANIONGAP 9 03/03/2020   Last lipids No results found for: "CHOL", "HDL", "LDLCALC", "LDLDIRECT", "TRIG", "CHOLHDL" Last hemoglobin A1c Lab Results  Component Value Date   HGBA1C 5.8 (A) 07/14/2022   Last thyroid functions Lab Results  Component Value Date   TSH 3.13 02/28/2022   Last vitamin D Lab Results  Component Value Date   VD25OH 31.52 04/13/2022  Objective:  BP 118/80 (BP Location: Right Arm, Patient Position: Sitting, Cuff Size: Normal)   Pulse 66   Temp 97.8 F (36.6 C) (Temporal)   Ht 5\' 3"  (1.6 m)   Wt 230 lb 3.2 oz (104.4 kg)   LMP 03/04/2022 (Exact Date)   SpO2 96%   Breastfeeding Yes   BMI 40.78 kg/m      Physical Exam Constitutional:      Appearance: She is obese.  Cardiovascular:     Rate and Rhythm: Normal rate.     Pulses: Normal pulses.  Pulmonary:     Effort: Pulmonary effort is normal.  Abdominal:     General: There is no distension.      Palpations: Abdomen is soft.     Tenderness: There is no abdominal tenderness. There is no guarding.  Musculoskeletal:     Lumbar back: Tenderness present. No bony tenderness. Normal range of motion. Negative right straight leg raise test and negative left straight leg raise test.  Neurological:     Mental Status: She is alert and oriented to person, place, and time.     Results for orders placed or performed in visit on 07/14/22  POCT urine pregnancy  Result Value Ref Range   Preg Test, Ur Negative Negative  POCT glycosylated hemoglobin (Hb A1C)  Result Value Ref Range   Hemoglobin A1C 5.8 (A) 4.0 - 5.6 %      Assessment & Plan:    Problem List Items Addressed This Visit       Other   Amenorrhea, secondary - Primary    Persistent amenorrhea since birth of 2.2yrs old daughter. She reports hx of irregular cycle to pregnancy 30yrs ago. She is still breastfeeding her 2.32yrs old daughter No vaginal bleeding with progesterone challenge Testosterone at 52 Normal prolactin, estradiol, FSH, TSH, CMP, CBC hgbA1c at 5.8% LMP 03/04/22-03/14/22 No acne, no hirsutism. Urine preg today: negative Entered ref to GYN Advised about need for weight loss      Prediabetes    hgbA1c at 5.8% Entered referral to nutritionist Advised about need for lifestyle modification      Relevant Orders   Referral to Nutrition and Diabetes Services   POCT glycosylated hemoglobin (Hb A1C) (Completed)   Other Visit Diagnoses     Morbid obesity ([place])       Relevant Orders   Referral to Nutrition and Diabetes Services   POCT glycosylated hemoglobin (Hb A1C) (Completed)   Acute bilateral low back pain with right-sided sciatica       Relevant Medications   ibuprofen (ADVIL) 600 MG tablet      Return in about 3 months (around 10/13/2022) for DM, Weight management.     Wilfred Lacy, NP

## 2022-07-14 NOTE — Patient Instructions (Addendum)
Start ibuprofen for back pain. Alternate between warm and cold compress as needed  hgbA1c at 5.8% prediabetes Entered referral to nutritionist Start daily exercise as discussed  Calorie Counting for Weight Loss Calories are units of energy. Your body needs a certain number of calories from food to keep going throughout the day. When you eat or drink more calories than your body needs, your body stores the extra calories mostly as fat. When you eat or drink fewer calories than your body needs, your body burns fat to get the energy it needs. Calorie counting means keeping track of how many calories you eat and drink each day. Calorie counting can be helpful if you need to lose weight. If you eat fewer calories than your body needs, you should lose weight. Ask your health care provider what a healthy weight is for you. For calorie counting to work, you will need to eat the right number of calories each day to lose a healthy amount of weight per week. A dietitian can help you figure out how many calories you need in a day and will suggest ways to reach your calorie goal. A healthy amount of weight to lose each week is usually 1-2 lb (0.5-0.9 kg). This usually means that your daily calorie intake should be reduced by 500-750 calories. Eating 1,200-1,500 calories a day can help most women lose weight. Eating 1,500-1,800 calories a day can help most men lose weight. What do I need to know about calorie counting? Work with your health care provider or dietitian to determine how many calories you should get each day. To meet your daily calorie goal, you will need to: Find out how many calories are in each food that you would like to eat. Try to do this before you eat. Decide how much of the food you plan to eat. Keep a food log. Do this by writing down what you ate and how many calories it had. To successfully lose weight, it is important to balance calorie counting with a healthy lifestyle that includes  regular activity. Where do I find calorie information?  The number of calories in a food can be found on a Nutrition Facts label. If a food does not have a Nutrition Facts label, try to look up the calories online or ask your dietitian for help. Remember that calories are listed per serving. If you choose to have more than one serving of a food, you will have to multiply the calories per serving by the number of servings you plan to eat. For example, the label on a package of bread might say that a serving size is 1 slice and that there are 90 calories in a serving. If you eat 1 slice, you will have eaten 90 calories. If you eat 2 slices, you will have eaten 180 calories. How do I keep a food log? After each time that you eat, record the following in your food log as soon as possible: What you ate. Be sure to include toppings, sauces, and other extras on the food. How much you ate. This can be measured in cups, ounces, or number of items. How many calories were in each food and drink. The total number of calories in the food you ate. Keep your food log near you, such as in a pocket-sized notebook or on an app or website on your mobile phone. Some programs will calculate calories for you and show you how many calories you have left to meet your daily  goal. What are some portion-control tips? Know how many calories are in a serving. This will help you know how many servings you can have of a certain food. Use a measuring cup to measure serving sizes. You could also try weighing out portions on a kitchen scale. With time, you will be able to estimate serving sizes for some foods. Take time to put servings of different foods on your favorite plates or in your favorite bowls and cups so you know what a serving looks like. Try not to eat straight from a food's packaging, such as from a bag or box. Eating straight from the package makes it hard to see how much you are eating and can lead to overeating. Put  the amount you would like to eat in a cup or on a plate to make sure you are eating the right portion. Use smaller plates, glasses, and bowls for smaller portions and to prevent overeating. Try not to multitask. For example, avoid watching TV or using your computer while eating. If it is time to eat, sit down at a table and enjoy your food. This will help you recognize when you are full. It will also help you be more mindful of what and how much you are eating. What are tips for following this plan? Reading food labels Check the calorie count compared with the serving size. The serving size may be smaller than what you are used to eating. Check the source of the calories. Try to choose foods that are high in protein, fiber, and vitamins, and low in saturated fat, trans fat, and sodium. Shopping Read nutrition labels while you shop. This will help you make healthy decisions about which foods to buy. Pay attention to nutrition labels for low-fat or fat-free foods. These foods sometimes have the same number of calories or more calories than the full-fat versions. They also often have added sugar, starch, or salt to make up for flavor that was removed with the fat. Make a grocery list of lower-calorie foods and stick to it. Cooking Try to cook your favorite foods in a healthier way. For example, try baking instead of frying. Use low-fat dairy products. Meal planning Use more fruits and vegetables. One-half of your plate should be fruits and vegetables. Include lean proteins, such as chicken, [place], and fish. Lifestyle Each week, aim to do one of the following: 150 minutes of moderate exercise, such as walking. 75 minutes of vigorous exercise, such as running. General information Know how many calories are in the foods you eat most often. This will help you calculate calorie counts faster. Find a way of tracking calories that works for you. Get creative. Try different apps or programs if writing  down calories does not work for you. What foods should I eat?  Eat nutritious foods. It is better to have a nutritious, high-calorie food, such as an avocado, than a food with few nutrients, such as a bag of potato chips. Use your calories on foods and drinks that will fill you up and will not leave you hungry soon after eating. Examples of foods that fill you up are nuts and nut butters, vegetables, lean proteins, and high-fiber foods such as whole grains. High-fiber foods are foods with more than 5 g of fiber per serving. Pay attention to calories in drinks. Low-calorie drinks include water and unsweetened drinks. The items listed above may not be a complete list of foods and beverages you can eat. Contact a dietitian for more  information. What foods should I limit? Limit foods or drinks that are not good sources of vitamins, minerals, or protein or that are high in unhealthy fats. These include: Candy. Other sweets. Sodas, specialty coffee drinks, alcohol, and juice. The items listed above may not be a complete list of foods and beverages you should avoid. Contact a dietitian for more information. How do I count calories when eating out? Pay attention to portions. Often, portions are much larger when eating out. Try these tips to keep portions smaller: Consider sharing a meal instead of getting your own. If you get your own meal, eat only half of it. Before you start eating, ask for a container and put half of your meal into it. When available, consider ordering smaller portions from the menu instead of full portions. Pay attention to your food and drink choices. Knowing the way food is cooked and what is included with the meal can help you eat fewer calories. If calories are listed on the menu, choose the lower-calorie options. Choose dishes that include vegetables, fruits, whole grains, low-fat dairy products, and lean proteins. Choose items that are boiled, broiled, grilled, or steamed.  Avoid items that are buttered, battered, fried, or served with cream sauce. Items labeled as crispy are usually fried, unless stated otherwise. Choose water, low-fat milk, unsweetened iced tea, or other drinks without added sugar. If you want an alcoholic beverage, choose a lower-calorie option, such as a glass of wine or light beer. Ask for dressings, sauces, and syrups on the side. These are usually high in calories, so you should limit the amount you eat. If you want a salad, choose a garden salad and ask for grilled meats. Avoid extra toppings such as bacon, cheese, or fried items. Ask for the dressing on the side, or ask for olive oil and vinegar or lemon to use as dressing. Estimate how many servings of a food you are given. Knowing serving sizes will help you be aware of how much food you are eating at restaurants. Where to find more information Centers for Disease Control and Prevention: FootballExhibition.com.br U.S. Department of Agriculture: WrestlingReporter.dk Summary Calorie counting means keeping track of how many calories you eat and drink each day. If you eat fewer calories than your body needs, you should lose weight. A healthy amount of weight to lose per week is usually 1-2 lb (0.5-0.9 kg). This usually means reducing your daily calorie intake by 500-750 calories. The number of calories in a food can be found on a Nutrition Facts label. If a food does not have a Nutrition Facts label, try to look up the calories online or ask your dietitian for help. Use smaller plates, glasses, and bowls for smaller portions and to prevent overeating. Use your calories on foods and drinks that will fill you up and not leave you hungry shortly after a meal. This information is not intended to replace advice given to you by your health care provider. Make sure you discuss any questions you have with your health care provider. Document Revised: 11/20/2019 Document Reviewed: 11/20/2019 Elsevier Patient Education  2023  Elsevier Inc.   How to Increase Your Level of Physical Activity Getting regular physical activity is important for your overall health and well-being. Most people do not get enough exercise. There are easy ways to increase your level of physical activity, even if you have not been very active in the past or if you are just starting out. What are the benefits of physical activity?  Physical activity has many short-term and long-term benefits. Being active on a regular basis can improve your physical and mental health as well as provide other benefits. Physical health benefits Helping you lose weight or maintain a healthy weight. Strengthening your muscles and bones. Reducing your risk of certain long-term (chronic) diseases, including heart disease, cancer, and diabetes. Being able to move around more easily and for longer periods of time without getting tired (increased endurance or stamina). Improving your ability to fight off illness (enhanced immunity). Being able to sleep better. Helping you stay healthy as you get older, including: Helping you stay mobile, or capable of walking and moving around. Preventing accidents, such as falls. Increasing life expectancy. Mental health benefits Boosting your mood and improving your self-esteem. Lowering your chance of having mental health problems, such as depression or anxiety. Helping you feel good about your body. Other benefits Finding new sources of fun and enjoyment. Meeting new people who share a common interest. Before you begin If you have a chronic illness or have not been active for a while, check with your health care provider about how to get started. Ask your health care provider what activities are safe for you. Start out slowly. Walking or doing some simple chair exercises is a good place to start, especially if you have not been active before or for a long time. Set goals that you can work toward. Ask your health care provider  how much exercise is [place] for you. In general, most adults should: Do moderate-intensity exercise for at least 150 minutes each week (30 minutes on most days of the week) or vigorous exercise for at least 75 minutes each week, or a combination of these. Moderate-intensity exercise can include walking at a quick pace, biking, yoga, water aerobics, or gardening. Vigorous exercise involves activities that take more effort, such as jogging or running, playing sports, swimming laps, or jumping rope. Do strength exercises on at least 2 days each week. This can include weight lifting, body weight exercises, and resistance-band exercises. How to be more physically active Make a plan  Try to find activities that you enjoy. You are more likely to commit to an exercise routine if it does not feel like a chore. If you have bone or joint problems, choose low-impact exercises, like walking or swimming. Use these tips for being successful with an exercise plan: Find a workout partner for accountability. Join a group or class, such as an aerobics class, cycling class, or sports team. Make family time active. Go for a walk, bike, or swim. Include a variety of exercises each week. Consider using a fitness tracker, such as a mobile phone app or a device worn like a watch, that will count the number of steps you take each day. Many people strive to reach 10,000 steps a day. Find ways to be active in your daily routines Besides your formal exercise plans, you can find ways to do physical activity during your daily routines, such as: Walking or biking to work or to the store. Taking the stairs instead of the elevator. Parking farther away from the door at work or at the store. Planning walking meetings. Walking around while you are on the phone. Where to find more information Centers for Disease Control and Prevention: CampusCasting.com.pt President's Council on Fitness, Sports & Nutrition:  www.fitness.gov ChooseMyPlate: http://www.harvey.com/ Contact a health care provider if: You have headaches, muscle aches, or joint pain that is concerning. You feel dizzy or light-headed while exercising. You  faint. You feel your heart skipping, racing, or fluttering. You have chest pain while exercising. Summary Exercise benefits your mind and body at any age, even if you are just starting out. If you have a chronic illness or have not been active for a while, check with your health care provider before increasing your physical activity. Choose activities that are safe and enjoyable for you. Ask your health care provider what activities are safe for you. Start slowly. Tell your health care provider if you have problems as you start to increase your activity level. This information is not intended to replace advice given to you by your health care provider. Make sure you discuss any questions you have with your health care provider. Document Revised: 02/04/2021 Document Reviewed: 02/04/2021 Elsevier Patient Education  2023 [employer].

## 2022-07-14 NOTE — Assessment & Plan Note (Signed)
hgbA1c at 5.8% Entered referral to nutritionist Advised about need for lifestyle modification

## 2022-07-14 NOTE — Assessment & Plan Note (Addendum)
Persistent amenorrhea since birth of 2.22yrs old daughter. She reports hx of irregular cycle to pregnancy 60yrs ago. She is still breastfeeding her 2.40yrs old daughter No vaginal bleeding with progesterone challenge Testosterone at 51 Normal prolactin, estradiol, FSH, TSH, CMP, CBC hgbA1c at 5.8% LMP 03/04/22-03/14/22 No acne, no hirsutism. Urine preg today: negative Entered ref to GYN Advised about need for weight loss

## 2022-07-23 DIAGNOSIS — Z419 Encounter for procedure for purposes other than remedying health state, unspecified: Secondary | ICD-10-CM | POA: Diagnosis not present

## 2022-07-24 ENCOUNTER — Encounter: Payer: Medicaid Other | Attending: Nurse Practitioner | Admitting: Skilled Nursing Facility1

## 2022-07-24 ENCOUNTER — Encounter: Payer: Self-pay | Admitting: Skilled Nursing Facility1

## 2022-07-24 DIAGNOSIS — Z6841 Body Mass Index (BMI) 40.0 and over, adult: Secondary | ICD-10-CM | POA: Insufficient documentation

## 2022-07-24 DIAGNOSIS — Z713 Dietary counseling and surveillance: Secondary | ICD-10-CM | POA: Diagnosis not present

## 2022-07-24 DIAGNOSIS — R7303 Prediabetes: Secondary | ICD-10-CM | POA: Insufficient documentation

## 2022-07-24 NOTE — Progress Notes (Signed)
Medical Nutrition Therapy  Appointment Start time:  2:04  Appointment End time:  3:00  Primary concerns today: wants to lose, prediabetes   Referral diagnosis: r73.03, e66 Preferred learning style: auditory, visual Learning readiness: ready   NUTRITION ASSESSMENT   Clinical Medical Hx: nephrectomy  Medications: pantoprazole Labs: A1C 5.8 Notable Signs/Symptoms: some back pain  Lifestyle & Dietary Hx  Pt states she recently made a change for anxiety medication so is still trying to figure it out. Pt states she does work with a talk therapist.  Pt states sh lives with her 34 year old nephew, daughter and her husband.  Pt state she is trying to figure out some stress relief mechanisms currently.  Pt states her husband likes to cook so he makes dinner.  Pt states she has a bowel movement daily.   Body Composition Scale 07/24/2022  Current Body Weight 229  Total Body Fat % 42.3  Visceral Fat 11  Fat-Free Mass % 57.6   Total Body Water % 43.3  Muscle-Mass lbs 32.5  BMI 40.1  Body Fat Displacement          Torso  lbs 60         Left Leg  lbs 12         Right Leg  lbs 12         Left Arm  lbs 6         Right Arm   lbs 6     Estimated daily fluid intake: 60-90 oz Supplements: N/A Sleep: not good from anxiety Stress / self-care: fairly high  Current average weekly physical activity: ADL's  24-Hr Dietary Recall First Meal: oatmeal Snack:  Second Meal: nuggets and mac n cheese Snack:  Third Meal: chicken alfredo Snack: milkshakes  Beverages: water, water + caffeine mix, juice  Estimated Energy Needs Calories: 1500    NUTRITION INTERVENTION  Nutrition education (E-1) on the following topics:  [place] verses processed foods Creating balanced meals Prediabetes verses Diabetes Blood sugar control  Handouts Provided Include  Detailed Myplate  Learning Style & Readiness for Change Teaching method utilized: Visual & Auditory  Demonstrated degree of understanding  via: Teach Back  Barriers to learning/adherence to lifestyle change: anxiety  Goals Established by Pt NEW: go for an hour walk most days of the week NEW: add in resistance 2-3 days a week NEW: eat non starchy vegetables 2 times a day 7 days a week    MONITORING & EVALUATION Dietary intake, weekly physical activity  Next Steps  Patient is to follow up in one month.

## 2022-08-07 DIAGNOSIS — F411 Generalized anxiety disorder: Secondary | ICD-10-CM | POA: Diagnosis not present

## 2022-08-07 DIAGNOSIS — F330 Major depressive disorder, recurrent, mild: Secondary | ICD-10-CM | POA: Diagnosis not present

## 2022-08-15 DIAGNOSIS — F330 Major depressive disorder, recurrent, mild: Secondary | ICD-10-CM | POA: Diagnosis not present

## 2022-08-15 DIAGNOSIS — F411 Generalized anxiety disorder: Secondary | ICD-10-CM | POA: Diagnosis not present

## 2022-08-23 DIAGNOSIS — Z419 Encounter for procedure for purposes other than remedying health state, unspecified: Secondary | ICD-10-CM | POA: Diagnosis not present

## 2022-08-25 ENCOUNTER — Encounter: Payer: Self-pay | Admitting: Nurse Practitioner

## 2022-08-25 ENCOUNTER — Ambulatory Visit (INDEPENDENT_AMBULATORY_CARE_PROVIDER_SITE_OTHER): Payer: Medicaid Other | Admitting: Nurse Practitioner

## 2022-08-25 ENCOUNTER — Ambulatory Visit (INDEPENDENT_AMBULATORY_CARE_PROVIDER_SITE_OTHER): Payer: Medicaid Other

## 2022-08-25 VITALS — BP 128/86 | HR 66 | Temp 97.5°F | Ht 63.0 in | Wt 232.2 lb

## 2022-08-25 DIAGNOSIS — M549 Dorsalgia, unspecified: Secondary | ICD-10-CM | POA: Insufficient documentation

## 2022-08-25 DIAGNOSIS — M545 Low back pain, unspecified: Secondary | ICD-10-CM | POA: Diagnosis not present

## 2022-08-25 LAB — POCT URINE PREGNANCY: Preg Test, Ur: NEGATIVE

## 2022-08-25 MED ORDER — CYCLOBENZAPRINE HCL 5 MG PO TABS: 5.0000 mg | ORAL_TABLET | Freq: Every day | ORAL | 0 refills | Status: DC

## 2022-08-25 MED ORDER — PREDNISONE 10 MG (21) PO TBPK: ORAL_TABLET | ORAL | 0 refills | Status: DC

## 2022-08-25 NOTE — Assessment & Plan Note (Addendum)
Persistent back pain x 56months, associated with right side radiculopathy to toes, constant, worse in supine position. Denies any previous injury, no muscle weakness, no paresthesia, no change in GI/GU function, no dyspareunia  No improvement with ibuprofen 800mg , tylenol 650mg  and hot compress  Check urine preg and lumber spine x-ray Start oral prednisone and flexeril Advised to pump and dump while taking these medications. Enter ref for PT if normal lumber spine x-ray

## 2022-08-25 NOTE — Progress Notes (Unsigned)
Initial visit for lower back pain with RT side radiculopathy x 2 months No known injury but did notice the pain started after playing on the floor with her daughter.

## 2022-08-25 NOTE — Progress Notes (Signed)
Established Patient Visit  Patient: Jacqueline Peck   DOB: 1996-02-22   26 y.o. Female  MRN: [id number] Visit Date: 08/25/2022  Subjective:    Chief Complaint  Patient presents with   Acute Visit    C/o lower back pain along with right leg pain x 4 weeks  Has been taking tylenol and using heating pads No other concerns    HPI Subacute back pain Persistent back pain x 20months, associated with right side radiculopathy to toes, constant, worse in supine position. Denies any previous injury, no muscle weakness, no paresthesia, no change in GI/GU function, no dyspareunia  No improvement with ibuprofen 800mg , tylenol 650mg  and hot compress  Check urine preg and lumber spine x-ray Start oral prednisone and flexeril Advised to pump and dump while taking these medications. Enter ref for PT if normal lumber spine x-ray  Reviewed medical, surgical, and social history today  Medications: Outpatient Medications Prior to Visit  Medication Sig   acetaminophen (TYLENOL) 325 MG tablet Take 650 mg by mouth every 6 (six) hours as needed.   pantoprazole (PROTONIX) 20 MG tablet TAKE 1 TABLET BY MOUTH EVERY DAY   [DISCONTINUED] ibuprofen (ADVIL) 600 MG tablet Take 1 tablet (600 mg total) by mouth every 8 (eight) hours as needed (with food).   No facility-administered medications prior to visit.   Reviewed past medical and social history.   ROS per HPI above      Objective:  BP 128/86 (BP Location: Right Arm, Patient Position: Sitting, Cuff Size: Normal)   Pulse 66   Temp (!) 97.5 F (36.4 C) (Temporal)   Ht 5\' 3"  (1.6 m)   Wt 232 lb 3.2 oz (105.3 kg)   SpO2 98%   BMI 41.13 kg/m      Physical Exam Constitutional:      Appearance: She is obese.  Cardiovascular:     Rate and Rhythm: Normal rate.     Pulses: Normal pulses.  Pulmonary:     Effort: Pulmonary effort is normal.  Abdominal:     General: There is no distension.     Palpations: Abdomen is soft.      Tenderness: There is no abdominal tenderness. There is no right CVA tenderness, left CVA tenderness or guarding.  Musculoskeletal:     Thoracic back: Normal.     Lumbar back: No spasms, tenderness or bony tenderness. Normal range of motion. Negative right straight leg raise test and negative left straight leg raise test.     Right lower leg: No edema.     Left lower leg: No edema.  Neurological:     Mental Status: She is alert and oriented to person, place, and time.     Results for orders placed or performed in visit on 08/25/22  POCT urine pregnancy  Result Value Ref Range   Preg Test, Ur Negative Negative      Assessment & Plan:    Problem List Items Addressed This Visit       Other   Subacute back pain - Primary    Persistent back pain x 43months, associated with right side radiculopathy to toes, constant, worse in supine position. Denies any previous injury, no muscle weakness, no paresthesia, no change in GI/GU function, no dyspareunia  No improvement with ibuprofen 800mg , tylenol 650mg  and hot compress  Check urine preg and lumber spine x-ray Start oral prednisone and flexeril Advised to pump and dump  while taking these medications. Enter ref for PT if normal lumber spine x-ray      Relevant Medications   predniSONE (STERAPRED UNI-PAK 21 TAB) 10 MG (21) TBPK tablet   cyclobenzaprine (FLEXERIL) 5 MG tablet   Other Relevant Orders   DG Lumbar Spine Complete   POCT urine pregnancy (Completed)   Return if symptoms worsen or fail to improve.     Wilfred Lacy, NP

## 2022-08-25 NOTE — Patient Instructions (Addendum)
Call [facility] to schedule appt: [phone].  Go to lab

## 2022-08-29 ENCOUNTER — Other Ambulatory Visit: Payer: Self-pay | Admitting: Nurse Practitioner

## 2022-08-29 DIAGNOSIS — M549 Dorsalgia, unspecified: Secondary | ICD-10-CM

## 2022-08-30 ENCOUNTER — Encounter: Payer: Medicaid Other | Attending: Nurse Practitioner | Admitting: Skilled Nursing Facility1

## 2022-08-30 ENCOUNTER — Encounter: Payer: Self-pay | Admitting: Skilled Nursing Facility1

## 2022-08-30 VITALS — Ht 63.0 in | Wt 229.6 lb

## 2022-08-30 DIAGNOSIS — E669 Obesity, unspecified: Secondary | ICD-10-CM | POA: Diagnosis not present

## 2022-08-30 DIAGNOSIS — R7303 Prediabetes: Secondary | ICD-10-CM

## 2022-08-30 NOTE — Progress Notes (Signed)
Medical Nutrition Therapy   Primary concerns today: wants to lose, prediabetes   Referral diagnosis: r73.03, e66 Preferred learning style: auditory, visual Learning readiness: ready   NUTRITION ASSESSMENT   Clinical Medical Hx: nephrectomy  Medications: pantoprazole Labs: A1C 5.8 Notable Signs/Symptoms: some back pain  Lifestyle & Dietary Hx   Pt states her nephew has been acting up. Pt states she has been trying to eat cleaner. Pt states she has been trying to eat more non starchy vegetables.  Pt states she has a lot of doctors appt between herself, her daughter, and her nephew keeping her busy.  Pt states she is going to start PT for back pain upcoming.    Body Composition Scale 07/24/2022 08/30/2022  Current Body Weight 229 229.6  Total Body Fat % 42.3 42.4  Visceral Fat 11 11  Fat-Free Mass % 57.6 57.5   Total Body Water % 43.3 43.2  Muscle-Mass lbs 32.5 32.5  BMI 40.1 40.3  Body Fat Displacement           Torso  lbs 60 60.2         Left Leg  lbs 12 12         Right Leg  lbs 12 12         Left Arm  lbs 6 6         Right Arm   lbs 6 6     Estimated daily fluid intake: 60-90 oz Supplements: N/A Sleep: better than previous  Stress / self-care: fairly high  Current average weekly physical activity: ADL's  24-Hr Dietary Recall First Meal: oatmeal or yogurt + granola or cereal Snack: protein bar Second Meal: skipped or protein bar  Snack:  Third Meal: chicken + mashed potatoes + corn Snack:  Beverages: water, water + caffeine mix  Estimated Energy Needs Calories: 1500    NUTRITION INTERVENTION  Nutrition education (E-1) on the following topics: Continued [place] verses processed foods Creating balanced meals Prediabetes verses Diabetes Blood sugar control  Handouts Previous Provided Include  Detailed Myplate  Learning Style & Readiness for Change Teaching method utilized: Visual & Auditory  Demonstrated degree of understanding via: Teach Back   Barriers to learning/adherence to lifestyle change: anxiety  Goals Established by Pt continue: go for an hour walk most days of the week continue: add in resistance 2-3 days a week continue: eat non starchy vegetables 2 times a day 7 days a week  NEW: drink more water  NEW: follow the PT advice   MONITORING & EVALUATION Dietary intake, weekly physical activity  Next Steps  Patient is to follow up in 4 months to ensure continued progress

## 2022-09-05 ENCOUNTER — Ambulatory Visit: Payer: Medicaid Other

## 2022-09-05 NOTE — Therapy (Incomplete)
OUTPATIENT PHYSICAL THERAPY THORACOLUMBAR EVALUATION   Patient Name: Jacqueline Peck MRN: [id number] DOB:1996-02-14, 26 y.o., female Today's Date: 09/05/2022    Past Medical History:  Diagnosis Date   GERD (gastroesophageal reflux disease)    Past Surgical History:  Procedure Laterality Date   CHOLECYSTECTOMY     kidney removed     NEPHRECTOMY Right    Patient Active Problem List   Diagnosis Date Noted   Subacute back pain 08/25/2022   Prediabetes 07/14/2022   Amenorrhea, secondary 02/28/2022   Major depressive disorder, single episode, moderate (HCC) 08/24/2020   Gastroesophageal reflux disease without esophagitis 07/13/2020   Polyhydramnios 09/15/2019    PCP: Bonna Gains  REFERRING PROVIDER: Bonna Gains  REFERRING DIAG: M54.9  Rationale for Evaluation and Treatment: Rehabilitation  THERAPY DIAG:  No diagnosis found.  ONSET DATE: ***  SUBJECTIVE:                                                                                                                                                                                           SUBJECTIVE STATEMENT: ***  PERTINENT HISTORY:  ***  PAIN:  Are you having pain? {OPRCPAIN:27236}  PRECAUTIONS: {Therapy precautions:24002}  WEIGHT BEARING RESTRICTIONS: {Yes ***/No:24003}  FALLS:  Has patient fallen in last 6 months? {fallsyesno:27318}  LIVING ENVIRONMENT: Lives with: {OPRC lives with:25569::"lives with their family"} Lives in: {Lives in:25570} Stairs: {opstairs:27293} Has following equipment at home: {Assistive devices:23999}  OCCUPATION: ***  PLOF: {PLOF:24004}  PATIENT GOALS: ***  NEXT MD VISIT:   OBJECTIVE:   DIAGNOSTIC FINDINGS:  ***  PATIENT SURVEYS:  {rehab surveys:24030}  SCREENING FOR RED FLAGS: Bowel or bladder incontinence: {Yes/No:304960894} Spinal tumors: {Yes/No:304960894} Cauda equina syndrome: {Yes/No:304960894} Compression fracture: {Yes/No:304960894} Abdominal aneurysm:  {Yes/No:304960894}  COGNITION: Overall cognitive status: {cognition:24006}     SENSATION: {sensation:27233}  MUSCLE LENGTH: Hamstrings: Right *** deg; Left *** deg Thomas test: Right *** deg; Left *** deg  POSTURE: {posture:25561}  PALPATION: ***  LUMBAR ROM:   AROM eval  Flexion   Extension   Right lateral flexion   Left lateral flexion   Right rotation   Left rotation    (Blank rows = not tested)  LOWER EXTREMITY ROM:     {AROM/PROM:27142}  Right eval Left eval  Hip flexion    Hip extension    Hip abduction    Hip adduction    Hip internal rotation    Hip external rotation    Knee flexion    Knee extension    Ankle dorsiflexion    Ankle plantarflexion    Ankle inversion    Ankle eversion     (Blank rows = not  tested)  LOWER EXTREMITY MMT:    MMT Right eval Left eval  Hip flexion    Hip extension    Hip abduction    Hip adduction    Hip internal rotation    Hip external rotation    Knee flexion    Knee extension    Ankle dorsiflexion    Ankle plantarflexion    Ankle inversion    Ankle eversion     (Blank rows = not tested)  LUMBAR SPECIAL TESTS:  {lumbar special test:25242}  FUNCTIONAL TESTS:  {Functional tests:24029}  GAIT: Distance walked: *** Assistive device utilized: {Assistive devices:23999} Level of assistance: {Levels of assistance:24026} Comments: ***  TODAY'S TREATMENT:                                                                                                                              DATE: ***    PATIENT EDUCATION:  Education details: *** Person educated: {Person educated:25204} Education method: {Education Method:25205} Education comprehension: {Education Comprehension:25206}  HOME EXERCISE PROGRAM: ***  ASSESSMENT:  CLINICAL IMPRESSION: Patient is a *** y.o. *** who was seen today for physical therapy evaluation and treatment for ***.   OBJECTIVE IMPAIRMENTS: {opptimpairments:25111}.   ACTIVITY  LIMITATIONS: {activitylimitations:27494}  PARTICIPATION LIMITATIONS: {participationrestrictions:25113}  PERSONAL FACTORS: {Personal factors:25162} are also affecting patient's functional outcome.   REHAB POTENTIAL: {rehabpotential:25112}  CLINICAL DECISION MAKING: {clinical decision making:25114}  EVALUATION COMPLEXITY: {Evaluation complexity:25115}   GOALS: Goals reviewed with patient? {yes/no:20286}  SHORT TERM GOALS: Target date: {follow up:25551}  *** Baseline: Goal status: {GOALSTATUS:25110}  2.  *** Baseline:  Goal status: {GOALSTATUS:25110}  3.  *** Baseline:  Goal status: {GOALSTATUS:25110}  4.  *** Baseline:  Goal status: {GOALSTATUS:25110}  5.  *** Baseline:  Goal status: {GOALSTATUS:25110}  6.  *** Baseline:  Goal status: {GOALSTATUS:25110}  LONG TERM GOALS: Target date: {follow up:25551}  *** Baseline:  Goal status: {GOALSTATUS:25110}  2.  *** Baseline:  Goal status: {GOALSTATUS:25110}  3.  *** Baseline:  Goal status: {GOALSTATUS:25110}  4.  *** Baseline:  Goal status: {GOALSTATUS:25110}  5.  *** Baseline:  Goal status: {GOALSTATUS:25110}  6.  *** Baseline:  Goal status: {GOALSTATUS:25110}  PLAN:  PT FREQUENCY: {rehab frequency:25116}  PT DURATION: {rehab duration:25117}  PLANNED INTERVENTIONS: {rehab planned interventions:25118::"Therapeutic exercises","Therapeutic activity","Neuromuscular re-education","Balance training","Gait training","Patient/Family education","Self Care","Joint mobilization"}.  PLAN FOR NEXT SESSION: ***   Cassie Freer, PT 09/05/2022, 5:02 PM

## 2022-09-06 ENCOUNTER — Ambulatory Visit: Payer: Medicaid Other

## 2022-09-18 NOTE — Therapy (Incomplete)
OUTPATIENT PHYSICAL THERAPY THORACOLUMBAR EVALUATION   Patient Name: Jacqueline Peck MRN: [id number] DOB:1996/09/11, 26 y.o., female Today's Date: 09/18/2022    Past Medical History:  Diagnosis Date   GERD (gastroesophageal reflux disease)    Past Surgical History:  Procedure Laterality Date   CHOLECYSTECTOMY     kidney removed     NEPHRECTOMY Right    Patient Active Problem List   Diagnosis Date Noted   Subacute back pain 08/25/2022   Prediabetes 07/14/2022   Amenorrhea, secondary 02/28/2022   Major depressive disorder, single episode, moderate (HCC) 08/24/2020   Gastroesophageal reflux disease without esophagitis 07/13/2020   Polyhydramnios 09/15/2019    PCP: Bonna Gains  REFERRING PROVIDER: Bonna Gains  REFERRING DIAG: M54.9  Rationale for Evaluation and Treatment: Rehabilitation  THERAPY DIAG:  No diagnosis found.  ONSET DATE: 08/25/22  SUBJECTIVE:                                                                                                                                                                                           SUBJECTIVE STATEMENT: The last couple days hasn't been too bad but I have days where I can't get out of bed. I wasn't doing anything when it started, I just got on the floor with my daughter and from there on it got worse. She presents with good overall strength and LE mobility but has pain with low back bending and extension. She also has some tightness in low back and hamstrings. Patient has core weakness that could be contributing to her back pain. She will benefit from skilled PT to address her LBP to be able to return to PLOF.   PERTINENT HISTORY:  Kidney removal 8 years and gall bladder removal 2 years ago  PAIN:  Are you having pain? Yes: NPRS scale: 8/10 Pain location: low back and into R hip and leg  Pain description: achy, dull, constant  Aggravating factors: nothing that I have noticed, I pick my daughter up a lot and she  is 3 Relieving factors: heating pad  PRECAUTIONS: None  WEIGHT BEARING RESTRICTIONS: No  FALLS:  Has patient fallen in last 6 months? No  LIVING ENVIRONMENT: Lives with: lives with their family Lives in: House/apartment Stairs: Yes: External: 2 steps; none Has following equipment at home: None  OCCUPATION: stay at home mom  PLOF: Independent  PATIENT GOALS: not be in pain  NEXT MD VISIT:   OBJECTIVE:   DIAGNOSTIC FINDINGS:  FINDINGS: There are five non-rib bearing lumbar-type vertebral bodies. No spondylolisthesis. Mild kyphosis centered at T11. There is no evidence for acute fracture or subluxation. Incidental note of a limbus vertebra  at L4. Unchanged contours of the endplates from T11, T12, L1 and L2 consistent with Schmorl's nodes. Intervertebral disc spaces are preserved without significant degenerative changes. Status post cholecystectomy.   IMPRESSION: Similar appearance of degenerative changes centered at the thoracolumbar junction.  SCREENING FOR RED FLAGS: Bowel or bladder incontinence: No Spinal tumors: No Cauda equina syndrome: No Compression fracture: No Abdominal aneurysm: No  COGNITION: Overall cognitive status: Within functional limits for tasks assessed     SENSATION: WFL  MUSCLE LENGTH: Hamstrings: Right mod tightness deg; Left mild tightness deg  POSTURE: rounded shoulders and increased lumbar lordosis  PALPATION: TTP L4-S2 and R SIJ  LUMBAR ROM:   AROM eval  Flexion Unable to touch, pain w/movement  Extension 25% with pain  Right lateral flexion WFL no pain  Left lateral flexion WFL no pain  Right rotation WFL no pain   Left rotation WFL no pain   (Blank rows = not tested)  LOWER EXTREMITY ROM:  grossly WFL    LOWER EXTREMITY MMT:  grossly 4+/5   LUMBAR SPECIAL TESTS:  Straight leg raise test: Positive and FABER test: Negative  FUNCTIONAL TESTS:  5 times sit to stand: 14.19s slight pain Timed up and go (TUG):  11.11  TODAY'S TREATMENT:                                                                                                                              DATE: 09/21/22-Eval    PATIENT EDUCATION:  Education details: HEP and POC Person educated: Patient Education method: Explanation Education comprehension: verbalized understanding  HOME EXERCISE PROGRAM: Access Code: [email or web address] URL: [email or web address] Date: 09/21/2022 Prepared by: Cassie Freer  Exercises - Supine Lower Trunk Rotation  - 1 x daily - 7 x weekly - 2 sets - 10 reps - Supine Bridge  - 1 x daily - 7 x weekly - 2 sets - 10 reps - Clamshell with Resistance  - 1 x daily - 7 x weekly - 2 sets - 10 reps - Supine Single Knee to Chest Stretch  - 1 x daily - 7 x weekly - 30 hold  ASSESSMENT:  CLINICAL IMPRESSION: Patient is a 26 y.o. female who was seen today for physical therapy evaluation and treatment for LBP. She states her pain started a few month ago while she got on the floor to play with her daughter. The pain got worse and within the last week it has felt the [place] it has in a while. She presents with good overall LE mobility and strength. However, she has pain with bending and extension and also has some core weakness. Patient will benefit from skilled PT intervention to address her LBP to return to PLOF   REHAB POTENTIAL: Good  CLINICAL DECISION MAKING: Stable/uncomplicated  EVALUATION COMPLEXITY: Low  GOALS: Goals reviewed with patient? Yes  SHORT TERM GOALS: Target date: 10/26/22  Patient will be independent with initial HEP.  Goal status: INITIAL   LONG TERM  GOALS: Target date: 12/07/22  Patient will be independent with advanced/ongoing HEP to improve outcomes and carryover.  Goal status: INITIAL  2.  Patient will report 75% improvement in low back pain to improve QOL.  Baseline: 8/10 at worst Goal status: INITIAL  3.  Patient will demonstrate full pain free lumbar ROM to perform ADLs.    Goal status: INITIAL  4.  Patient will demonstrate improved functional strength as demonstrated by <12s on 5xSTS. Baseline: 14.19s w/pain Goal status: INITIAL  PLAN:  PT FREQUENCY: 2x/week  PT DURATION: 10 weeks  PLANNED INTERVENTIONS: Therapeutic exercises, Therapeutic activity, Neuromuscular re-education, Balance training, Gait training, Patient/Family education, Self Care, Joint mobilization, Dry Needling, Electrical stimulation, Cryotherapy, Moist heat, Traction, Ionotophoresis 4mg /ml Dexamethasone, and Manual therapy.  PLAN FOR NEXT SESSION: Stretching, low back mobility, light strengthening   , PT 09/18/2022, 3:30 PM

## 2022-09-21 ENCOUNTER — Ambulatory Visit: Payer: Medicaid Other | Attending: Nurse Practitioner

## 2022-09-21 DIAGNOSIS — M5459 Other low back pain: Secondary | ICD-10-CM | POA: Insufficient documentation

## 2022-09-21 DIAGNOSIS — M62830 Muscle spasm of back: Secondary | ICD-10-CM | POA: Diagnosis not present

## 2022-09-21 DIAGNOSIS — M549 Dorsalgia, unspecified: Secondary | ICD-10-CM | POA: Insufficient documentation

## 2022-09-22 DIAGNOSIS — Z419 Encounter for procedure for purposes other than remedying health state, unspecified: Secondary | ICD-10-CM | POA: Diagnosis not present

## 2022-09-22 DIAGNOSIS — F330 Major depressive disorder, recurrent, mild: Secondary | ICD-10-CM | POA: Diagnosis not present

## 2022-09-22 DIAGNOSIS — F411 Generalized anxiety disorder: Secondary | ICD-10-CM | POA: Diagnosis not present

## 2022-09-28 ENCOUNTER — Ambulatory Visit: Payer: Medicaid Other | Attending: Nurse Practitioner | Admitting: Physical Therapy

## 2022-10-03 ENCOUNTER — Ambulatory Visit: Payer: Medicaid Other

## 2022-10-03 NOTE — Therapy (Incomplete)
OUTPATIENT PHYSICAL THERAPY THORACOLUMBAR EVALUATION   Patient Name: Jacqueline Peck MRN: [id number] DOB:12/12/1995, 26 y.o., female Today's Date: 09/18/2022    Past Medical History:  Diagnosis Date   GERD (gastroesophageal reflux disease)    Past Surgical History:  Procedure Laterality Date   CHOLECYSTECTOMY     kidney removed     NEPHRECTOMY Right    Patient Active Problem List   Diagnosis Date Noted   Subacute back pain 08/25/2022   Prediabetes 07/14/2022   Amenorrhea, secondary 02/28/2022   Major depressive disorder, single episode, moderate (HCC) 08/24/2020   Gastroesophageal reflux disease without esophagitis 07/13/2020   Polyhydramnios 09/15/2019    PCP: Bonna Gains  REFERRING PROVIDER: Bonna Gains  REFERRING DIAG: M54.9  Rationale for Evaluation and Treatment: Rehabilitation  THERAPY DIAG:  No diagnosis found.  ONSET DATE: 08/25/22  SUBJECTIVE:                                                                                                                                                                                           SUBJECTIVE STATEMENT: The last couple days hasn't been too bad but I have days where I can't get out of bed. I wasn't doing anything when it started, I just got on the floor with my daughter and from there on it got worse. She presents with good overall strength and LE mobility but has pain with low back bending and extension. She also has some tightness in low back and hamstrings. Patient has core weakness that could be contributing to her back pain. She will benefit from skilled PT to address her LBP to be able to return to PLOF.   PERTINENT HISTORY:  Kidney removal 8 years and gall bladder removal 2 years ago  PAIN:  Are you having pain? Yes: NPRS scale: 8/10 Pain location: low back and into R hip and leg  Pain description: achy, dull, constant  Aggravating factors: nothing that I have noticed, I pick my daughter up a lot and she  is 3 Relieving factors: heating pad  PRECAUTIONS: None  WEIGHT BEARING RESTRICTIONS: No  FALLS:  Has patient fallen in last 6 months? No  LIVING ENVIRONMENT: Lives with: lives with their family Lives in: House/apartment Stairs: Yes: External: 2 steps; none Has following equipment at home: None  OCCUPATION: stay at home mom  PLOF: Independent  PATIENT GOALS: not be in pain  NEXT MD VISIT:   OBJECTIVE:   DIAGNOSTIC FINDINGS:  FINDINGS: There are five non-rib bearing lumbar-type vertebral bodies. No spondylolisthesis. Mild kyphosis centered at T11. There is no evidence for acute fracture or subluxation. Incidental note of a limbus vertebra  at L4. Unchanged contours of the endplates from T11, T12, L1 and L2 consistent with Schmorl's nodes. Intervertebral disc spaces are preserved without significant degenerative changes. Status post cholecystectomy.   IMPRESSION: Similar appearance of degenerative changes centered at the thoracolumbar junction.  SCREENING FOR RED FLAGS: Bowel or bladder incontinence: No Spinal tumors: No Cauda equina syndrome: No Compression fracture: No Abdominal aneurysm: No  COGNITION: Overall cognitive status: Within functional limits for tasks assessed     SENSATION: WFL  MUSCLE LENGTH: Hamstrings: Right mod tightness deg; Left mild tightness deg  POSTURE: rounded shoulders and increased lumbar lordosis  PALPATION: TTP L4-S2 and R SIJ  LUMBAR ROM:   AROM eval  Flexion Unable to touch, pain w/movement  Extension 25% with pain  Right lateral flexion WFL no pain  Left lateral flexion WFL no pain  Right rotation WFL no pain   Left rotation WFL no pain   (Blank rows = not tested)  LOWER EXTREMITY ROM:  grossly WFL    LOWER EXTREMITY MMT:  grossly 4+/5   LUMBAR SPECIAL TESTS:  Straight leg raise test: Positive and FABER test: Negative  FUNCTIONAL TESTS:  5 times sit to stand: 14.19s slight pain Timed up and go (TUG):  11.11  TODAY'S TREATMENT:                                                                                                                              DATE: 10/03/22 NuStep HS stretch Feet on pball Bridges STS with ball  Leg ext HS curls   09/21/22-Eval    PATIENT EDUCATION:  Education details: HEP and POC Person educated: Patient Education method: Explanation Education comprehension: verbalized understanding  HOME EXERCISE PROGRAM: Access Code: [email or web address] URL: [email or web address] Date: 09/21/2022 Prepared by: Cassie Freer  Exercises - Supine Lower Trunk Rotation  - 1 x daily - 7 x weekly - 2 sets - 10 reps - Supine Bridge  - 1 x daily - 7 x weekly - 2 sets - 10 reps - Clamshell with Resistance  - 1 x daily - 7 x weekly - 2 sets - 10 reps - Supine Single Knee to Chest Stretch  - 1 x daily - 7 x weekly - 30 hold  ASSESSMENT:  CLINICAL IMPRESSION: Patient is a 26 y.o. female who was seen today for physical therapy evaluation and treatment for LBP. She states her pain started a few month ago while she got on the floor to play with her daughter. The pain got worse and within the last week it has felt the [place] it has in a while. She presents with good overall LE mobility and strength. However, she has pain with bending and extension and also has some core weakness. Patient will benefit from skilled PT intervention to address her LBP to return to PLOF   REHAB POTENTIAL: Good  CLINICAL DECISION MAKING: Stable/uncomplicated  EVALUATION COMPLEXITY: Low  GOALS: Goals reviewed with patient? Yes  SHORT TERM GOALS: Target  date: 10/26/22  Patient will be independent with initial HEP.  Goal status: INITIAL   LONG TERM GOALS: Target date: 12/07/22  Patient will be independent with advanced/ongoing HEP to improve outcomes and carryover.  Goal status: INITIAL  2.  Patient will report 75% improvement in low back pain to improve QOL.  Baseline: 8/10 at worst Goal  status: INITIAL  3.  Patient will demonstrate full pain free lumbar ROM to perform ADLs.   Goal status: INITIAL  4.  Patient will demonstrate improved functional strength as demonstrated by <12s on 5xSTS. Baseline: 14.19s w/pain Goal status: INITIAL  PLAN:  PT FREQUENCY: 2x/week  PT DURATION: 10 weeks  PLANNED INTERVENTIONS: Therapeutic exercises, Therapeutic activity, Neuromuscular re-education, Balance training, Gait training, Patient/Family education, Self Care, Joint mobilization, Dry Needling, Electrical stimulation, Cryotherapy, Moist heat, Traction, Ionotophoresis 4mg /ml Dexamethasone, and Manual therapy.  PLAN FOR NEXT SESSION: Stretching, low back mobility, light strengthening   , PT 09/18/2022, 3:30 PM

## 2022-10-03 NOTE — Therapy (Deleted)
OUTPATIENT PHYSICAL THERAPY THORACOLUMBAR EVALUATION   Patient Name: Jacqueline Peck MRN: [id number] DOB:1996-02-14, 26 y.o., female Today's Date: 09/05/2022    Past Medical History:  Diagnosis Date   GERD (gastroesophageal reflux disease)    Past Surgical History:  Procedure Laterality Date   CHOLECYSTECTOMY     kidney removed     NEPHRECTOMY Right    Patient Active Problem List   Diagnosis Date Noted   Subacute back pain 08/25/2022   Prediabetes 07/14/2022   Amenorrhea, secondary 02/28/2022   Major depressive disorder, single episode, moderate (HCC) 08/24/2020   Gastroesophageal reflux disease without esophagitis 07/13/2020   Polyhydramnios 09/15/2019    PCP: Bonna Gains  REFERRING PROVIDER: Bonna Gains  REFERRING DIAG: M54.9  Rationale for Evaluation and Treatment: Rehabilitation  THERAPY DIAG:  No diagnosis found.  ONSET DATE: ***  SUBJECTIVE:                                                                                                                                                                                           SUBJECTIVE STATEMENT: ***  PERTINENT HISTORY:  ***  PAIN:  Are you having pain? {OPRCPAIN:27236}  PRECAUTIONS: {Therapy precautions:24002}  WEIGHT BEARING RESTRICTIONS: {Yes ***/No:24003}  FALLS:  Has patient fallen in last 6 months? {fallsyesno:27318}  LIVING ENVIRONMENT: Lives with: {OPRC lives with:25569::"lives with their family"} Lives in: {Lives in:25570} Stairs: {opstairs:27293} Has following equipment at home: {Assistive devices:23999}  OCCUPATION: ***  PLOF: {PLOF:24004}  PATIENT GOALS: ***  NEXT MD VISIT:   OBJECTIVE:   DIAGNOSTIC FINDINGS:  ***  PATIENT SURVEYS:  {rehab surveys:24030}  SCREENING FOR RED FLAGS: Bowel or bladder incontinence: {Yes/No:304960894} Spinal tumors: {Yes/No:304960894} Cauda equina syndrome: {Yes/No:304960894} Compression fracture: {Yes/No:304960894} Abdominal aneurysm:  {Yes/No:304960894}  COGNITION: Overall cognitive status: {cognition:24006}     SENSATION: {sensation:27233}  MUSCLE LENGTH: Hamstrings: Right *** deg; Left *** deg Thomas test: Right *** deg; Left *** deg  POSTURE: {posture:25561}  PALPATION: ***  LUMBAR ROM:   AROM eval  Flexion   Extension   Right lateral flexion   Left lateral flexion   Right rotation   Left rotation    (Blank rows = not tested)  LOWER EXTREMITY ROM:     {AROM/PROM:27142}  Right eval Left eval  Hip flexion    Hip extension    Hip abduction    Hip adduction    Hip internal rotation    Hip external rotation    Knee flexion    Knee extension    Ankle dorsiflexion    Ankle plantarflexion    Ankle inversion    Ankle eversion     (Blank rows = not  tested)  LOWER EXTREMITY MMT:    MMT Right eval Left eval  Hip flexion    Hip extension    Hip abduction    Hip adduction    Hip internal rotation    Hip external rotation    Knee flexion    Knee extension    Ankle dorsiflexion    Ankle plantarflexion    Ankle inversion    Ankle eversion     (Blank rows = not tested)  LUMBAR SPECIAL TESTS:  {lumbar special test:25242}  FUNCTIONAL TESTS:  {Functional tests:24029}  GAIT: Distance walked: *** Assistive device utilized: {Assistive devices:23999} Level of assistance: {Levels of assistance:24026} Comments: ***  TODAY'S TREATMENT:                                                                                                                              DATE: ***    PATIENT EDUCATION:  Education details: *** Person educated: {Person educated:25204} Education method: {Education Method:25205} Education comprehension: {Education Comprehension:25206}  HOME EXERCISE PROGRAM: ***  ASSESSMENT:  CLINICAL IMPRESSION: Patient is a *** y.o. *** who was seen today for physical therapy evaluation and treatment for ***.   OBJECTIVE IMPAIRMENTS: {opptimpairments:25111}.   ACTIVITY  LIMITATIONS: {activitylimitations:27494}  PARTICIPATION LIMITATIONS: {participationrestrictions:25113}  PERSONAL FACTORS: {Personal factors:25162} are also affecting patient's functional outcome.   REHAB POTENTIAL: {rehabpotential:25112}  CLINICAL DECISION MAKING: {clinical decision making:25114}  EVALUATION COMPLEXITY: {Evaluation complexity:25115}   GOALS: Goals reviewed with patient? {yes/no:20286}  SHORT TERM GOALS: Target date: {follow up:25551}  *** Baseline: Goal status: {GOALSTATUS:25110}  2.  *** Baseline:  Goal status: {GOALSTATUS:25110}  3.  *** Baseline:  Goal status: {GOALSTATUS:25110}  4.  *** Baseline:  Goal status: {GOALSTATUS:25110}  5.  *** Baseline:  Goal status: {GOALSTATUS:25110}  6.  *** Baseline:  Goal status: {GOALSTATUS:25110}  LONG TERM GOALS: Target date: {follow up:25551}  *** Baseline:  Goal status: {GOALSTATUS:25110}  2.  *** Baseline:  Goal status: {GOALSTATUS:25110}  3.  *** Baseline:  Goal status: {GOALSTATUS:25110}  4.  *** Baseline:  Goal status: {GOALSTATUS:25110}  5.  *** Baseline:  Goal status: {GOALSTATUS:25110}  6.  *** Baseline:  Goal status: {GOALSTATUS:25110}  PLAN:  PT FREQUENCY: {rehab frequency:25116}  PT DURATION: {rehab duration:25117}  PLANNED INTERVENTIONS: {rehab planned interventions:25118::"Therapeutic exercises","Therapeutic activity","Neuromuscular re-education","Balance training","Gait training","Patient/Family education","Self Care","Joint mobilization"}.  PLAN FOR NEXT SESSION: ***   Cassie Freer, PT 09/05/2022, 5:02 PM

## 2022-10-05 ENCOUNTER — Ambulatory Visit: Payer: Medicaid Other | Admitting: Physical Therapy

## 2022-10-10 ENCOUNTER — Ambulatory Visit: Payer: Medicaid Other

## 2022-10-23 DIAGNOSIS — Z419 Encounter for procedure for purposes other than remedying health state, unspecified: Secondary | ICD-10-CM | POA: Diagnosis not present

## 2022-10-26 ENCOUNTER — Telehealth (INDEPENDENT_AMBULATORY_CARE_PROVIDER_SITE_OTHER): Payer: Medicaid Other | Admitting: Nurse Practitioner

## 2022-10-26 ENCOUNTER — Encounter: Payer: Self-pay | Admitting: Nurse Practitioner

## 2022-10-26 DIAGNOSIS — U071 COVID-19: Secondary | ICD-10-CM

## 2022-10-26 NOTE — Progress Notes (Signed)
Virtual Visit via Video Note  I connected withNAME@ on 10/26/22 at  1:00 PM EST by a video enabled telemedicine application and verified that I am speaking with the correct person using two identifiers.  Location: Patient:Home Provider: Office Participants: patient and provider  I discussed the limitations of evaluation and management by telemedicine and the availability of in person appointments. I also discussed with the patient that there may be a patient responsible charge related to this service. The patient expressed understanding and agreed to proceed.  CC: sinus congestion x 3days with positive COVID test  History of Present Illness:  LMP 09/07/2022, only use of condom for contraception Ongoing breastfeeding. URI  This is a new problem. The current episode started in the past 7 days. The problem has been gradually improving. There has been no fever. Associated symptoms include congestion, headaches, joint pain, rhinorrhea, sinus pain and sneezing. Pertinent negatives include no abdominal pain, chest pain, coughing, diarrhea, dysuria, ear pain, joint swelling, nausea, neck pain, plugged ear sensation, rash, sore throat, swollen glands, vomiting or wheezing. She has tried nothing for the symptoms.    Observations/Objective: Physical Exam Constitutional:      General: She is not in acute distress. Pulmonary:     Effort: Pulmonary effort is normal.  Neurological:     Mental Status: She is alert and oriented to person, place, and time.     Assessment and Plan: Jacqueline Peck was seen today for acute visit.  Diagnoses and all orders for this visit:  COVID-19   Follow Up Instructions: Unable to use antiviral due to ongoing breast feeding and amenorrhea-possible pregnancy. Flonase and Afrin use: apply 1spray of afrin in each nare, wait 16mins, then apply 2sprays of flonase in each nare. Use both nasal spray consecutively x 3days, then flonase only for at least 14days. Encourage  adequate oral hydration. Use over-the-counter  "cold" medicines  such as Dayquil and/or nyquil for sinus congestion Use" Delsym" or" Robitussin" cough syrup varieties for cough.  You can use plain "Tylenol" or "Advi"l for fever, chills and achyness.   I discussed the assessment and treatment plan with the patient. The patient was provided an opportunity to ask questions and all were answered. The patient agreed with the plan and demonstrated an understanding of the instructions.   The patient was advised to call back or seek an in-person evaluation if the symptoms worsen or if the condition fails to improve as anticipated.  Wilfred Lacy, NP

## 2022-10-26 NOTE — Patient Instructions (Addendum)
Reschedule upcoming appt to the following week. Unable to use antiviral due to ongoing breast feeding and amenorrhea-possible pregnancy. Flonase and Afrin use: apply 1spray of afrin in each nare, wait 42mins, then apply 2sprays of flonase in each nare. Use both nasal spray consecutively x 3days, then flonase only for at least 14days. Encourage adequate oral hydration. Use over-the-counter  "cold" medicines  such as Dayquil and/or nyquil for sinus congestion Use" Delsym" or" Robitussin" cough syrup varieties for cough.  You can use plain "Tylenol" or "Advi"l for fever, chills and achyness.

## 2022-10-30 ENCOUNTER — Ambulatory Visit: Payer: Medicaid Other | Admitting: Nurse Practitioner

## 2022-10-31 DIAGNOSIS — F330 Major depressive disorder, recurrent, mild: Secondary | ICD-10-CM | POA: Diagnosis not present

## 2022-10-31 DIAGNOSIS — F411 Generalized anxiety disorder: Secondary | ICD-10-CM | POA: Diagnosis not present

## 2022-11-22 DIAGNOSIS — F330 Major depressive disorder, recurrent, mild: Secondary | ICD-10-CM | POA: Diagnosis not present

## 2022-11-22 DIAGNOSIS — F411 Generalized anxiety disorder: Secondary | ICD-10-CM | POA: Diagnosis not present

## 2022-11-23 DIAGNOSIS — Z419 Encounter for procedure for purposes other than remedying health state, unspecified: Secondary | ICD-10-CM | POA: Diagnosis not present

## 2022-11-23 DIAGNOSIS — F330 Major depressive disorder, recurrent, mild: Secondary | ICD-10-CM | POA: Diagnosis not present

## 2022-11-23 DIAGNOSIS — F411 Generalized anxiety disorder: Secondary | ICD-10-CM | POA: Diagnosis not present

## 2022-12-22 DIAGNOSIS — Z419 Encounter for procedure for purposes other than remedying health state, unspecified: Secondary | ICD-10-CM | POA: Diagnosis not present

## 2023-01-02 ENCOUNTER — Ambulatory Visit: Payer: Medicaid Other | Admitting: Skilled Nursing Facility1

## 2023-01-02 DIAGNOSIS — F411 Generalized anxiety disorder: Secondary | ICD-10-CM | POA: Diagnosis not present

## 2023-01-02 DIAGNOSIS — F330 Major depressive disorder, recurrent, mild: Secondary | ICD-10-CM | POA: Diagnosis not present

## 2023-01-16 DIAGNOSIS — F411 Generalized anxiety disorder: Secondary | ICD-10-CM | POA: Diagnosis not present

## 2023-01-16 DIAGNOSIS — F330 Major depressive disorder, recurrent, mild: Secondary | ICD-10-CM | POA: Diagnosis not present

## 2023-01-22 DIAGNOSIS — Z419 Encounter for procedure for purposes other than remedying health state, unspecified: Secondary | ICD-10-CM | POA: Diagnosis not present

## 2023-02-07 ENCOUNTER — Telehealth: Payer: Self-pay | Admitting: Nurse Practitioner

## 2023-02-07 NOTE — Telephone Encounter (Signed)
LVM for patient to schedule follow up apt with PCP. AS, CMA

## 2023-02-21 DIAGNOSIS — Z419 Encounter for procedure for purposes other than remedying health state, unspecified: Secondary | ICD-10-CM | POA: Diagnosis not present

## 2023-03-20 DIAGNOSIS — F411 Generalized anxiety disorder: Secondary | ICD-10-CM | POA: Diagnosis not present

## 2023-03-20 DIAGNOSIS — F330 Major depressive disorder, recurrent, mild: Secondary | ICD-10-CM | POA: Diagnosis not present

## 2023-03-24 DIAGNOSIS — Z419 Encounter for procedure for purposes other than remedying health state, unspecified: Secondary | ICD-10-CM | POA: Diagnosis not present

## 2023-04-23 DIAGNOSIS — Z419 Encounter for procedure for purposes other than remedying health state, unspecified: Secondary | ICD-10-CM | POA: Diagnosis not present

## 2023-05-24 DIAGNOSIS — Z419 Encounter for procedure for purposes other than remedying health state, unspecified: Secondary | ICD-10-CM | POA: Diagnosis not present

## 2023-06-24 DIAGNOSIS — Z419 Encounter for procedure for purposes other than remedying health state, unspecified: Secondary | ICD-10-CM | POA: Diagnosis not present

## 2023-07-24 DIAGNOSIS — Z419 Encounter for procedure for purposes other than remedying health state, unspecified: Secondary | ICD-10-CM | POA: Diagnosis not present

## 2023-08-24 DIAGNOSIS — Z419 Encounter for procedure for purposes other than remedying health state, unspecified: Secondary | ICD-10-CM | POA: Diagnosis not present

## 2023-09-03 DIAGNOSIS — F411 Generalized anxiety disorder: Secondary | ICD-10-CM | POA: Diagnosis not present

## 2023-09-03 DIAGNOSIS — F330 Major depressive disorder, recurrent, mild: Secondary | ICD-10-CM | POA: Diagnosis not present

## 2023-09-11 ENCOUNTER — Ambulatory Visit: Payer: Medicaid Other | Admitting: Nurse Practitioner

## 2023-09-11 DIAGNOSIS — I10 Essential (primary) hypertension: Secondary | ICD-10-CM | POA: Insufficient documentation

## 2023-09-11 DIAGNOSIS — R030 Elevated blood-pressure reading, without diagnosis of hypertension: Secondary | ICD-10-CM | POA: Diagnosis not present

## 2023-09-11 DIAGNOSIS — Z13220 Encounter for screening for lipoid disorders: Secondary | ICD-10-CM

## 2023-09-11 DIAGNOSIS — R7303 Prediabetes: Secondary | ICD-10-CM

## 2023-09-11 DIAGNOSIS — F339 Major depressive disorder, recurrent, unspecified: Secondary | ICD-10-CM | POA: Diagnosis not present

## 2023-09-11 DIAGNOSIS — Z136 Encounter for screening for cardiovascular disorders: Secondary | ICD-10-CM

## 2023-09-11 DIAGNOSIS — N912 Amenorrhea, unspecified: Secondary | ICD-10-CM | POA: Diagnosis not present

## 2023-09-11 DIAGNOSIS — M25511 Pain in right shoulder: Secondary | ICD-10-CM | POA: Diagnosis not present

## 2023-09-11 DIAGNOSIS — E559 Vitamin D deficiency, unspecified: Secondary | ICD-10-CM

## 2023-09-11 LAB — COMPREHENSIVE METABOLIC PANEL
ALT: 18 U/L (ref 0–35)
AST: 16 U/L (ref 0–37)
Albumin: 4.1 g/dL (ref 3.5–5.2)
Alkaline Phosphatase: 99 U/L (ref 39–117)
BUN: 10 mg/dL (ref 6–23)
CO2: 26 meq/L (ref 19–32)
Calcium: 9.2 mg/dL (ref 8.4–10.5)
Chloride: 102 meq/L (ref 96–112)
Creatinine, Ser: 0.83 mg/dL (ref 0.40–1.20)
GFR: 95.92 mL/min (ref >60.00)
Glucose, Bld: 105 mg/dL — ABNORMAL HIGH (ref 70–99)
Potassium: 4.2 meq/L (ref 3.5–5.1)
Sodium: 138 meq/L (ref 135–145)
Total Bilirubin: 0.4 mg/dL (ref 0.2–1.2)
Total Protein: 6.9 g/dL (ref 6.0–8.3)

## 2023-09-11 LAB — HEMOGLOBIN A1C: Hgb A1c MFr Bld: 6 % (ref 4.6–6.5)

## 2023-09-11 LAB — TSH: TSH: 3.06 u[IU]/mL (ref 0.35–5.50)

## 2023-09-11 LAB — LIPID PANEL
Cholesterol: 168 mg/dL (ref 0–200)
HDL: 33.7 mg/dL — ABNORMAL LOW (ref >39.00)
LDL Cholesterol: 111 mg/dL — ABNORMAL HIGH (ref 0–99)
NonHDL: 134.22
Total CHOL/HDL Ratio: 5
Triglycerides: 114 mg/dL (ref 0.0–149.0)
VLDL: 22.8 mg/dL (ref 0.0–40.0)

## 2023-09-11 LAB — VITAMIN D 25 HYDROXY (VIT D DEFICIENCY, FRACTURES): VITD: 20.95 ng/mL — ABNORMAL LOW (ref 30.00–100.00)

## 2023-09-11 NOTE — Assessment & Plan Note (Signed)
Repeat hgbA1c 

## 2023-09-11 NOTE — Assessment & Plan Note (Signed)
No acne, no hirsutism CT pelvic 2021: normal uterus and ovaries LMP 06/01/2023 Sexually active Check urine pregnancy today: unable to provide urine sample. She plans to return to lab Advised to f/up with GYN

## 2023-09-11 NOTE — Assessment & Plan Note (Signed)
Exercise: no exercise Had 2 appts with nutritionist. States she has implemented suggestions from the nutritionist Wt Readings from Last 3 Encounters:  09/11/23 241 lb 9.6 oz (109.6 kg)  08/30/22 229 lb 9.6 oz (104.1 kg)  08/25/22 232 lb 3.2 oz (105.3 kg)    Check THYROID, hgbA1c, CMP, and lipid panel Entered referral to weight management clinic. Provided printed information on exercise

## 2023-09-11 NOTE — Progress Notes (Unsigned)
Established Patient Visit  Patient: Jacqueline Peck   DOB: Jun 05, 1996   27 y.o. Female  MRN: [id number] Visit Date: 09/13/2023  Subjective:    Chief Complaint  Patient presents with   OFFICE VISIT     Weight loss and shoulder pain. PT voiced shoulder and chest pain for 1 month it has become worse over the past week. PT is due for cervical screening. PT also C/O of anxiety    HPI Morbid obesity (HCC) Exercise: no exercise Had 2 appts with nutritionist. States she has implemented suggestions from the nutritionist Wt Readings from Last 3 Encounters:  09/11/23 241 lb 9.6 oz (109.6 kg)  08/30/22 229 lb 9.6 oz (104.1 kg)  08/25/22 232 lb 3.2 oz (105.3 kg)    Check THYROID, hgbA1c, CMP, and lipid panel Entered referral to weight management clinic. Provided printed information on exercise  Right anterior shoulder pain onset 53month ago, intermittent, pain last a few minutes and radiates to right upper chest, no trigger, no injury, she is right handed, use of tylenol 500mg  occassioanlly. No weakness, no paresthesia, no swelling.  Normal exam today Advised to use warm compress as needed and OVER THE COUNTER pain med prn.  Prediabetes Repeat hgbA1c  Elevated BP without diagnosis of hypertension Advised to maintain DASH diet BP Readings from Last 3 Encounters:  09/11/23 (!) 140/94  08/25/22 128/86  07/14/22 118/80    F/up in 2weeks  Amenorrhea No acne, no hirsutism CT pelvic 2021: normal uterus and ovaries LMP 06/01/2023 Sexually active Check urine pregnancy today: unable to provide urine sample. She plans to return to lab Advised to f/up with GYN  Depression, recurrent (HCC) Under the care of [facility] for therapy and [facility] for medication management   Reviewed medical, surgical, and social history today  Medications: Outpatient Medications Prior to Visit  Medication Sig   acetaminophen (TYLENOL) 325 MG tablet Take 650  mg by mouth every 6 (six) hours as needed.   pantoprazole (PROTONIX) 20 MG tablet TAKE 1 TABLET BY MOUTH EVERY DAY   No facility-administered medications prior to visit.   Reviewed past medical and social history.   ROS per HPI above      Objective:  BP (!) 140/94 (BP Location: Right Arm, Patient Position: Sitting, Cuff Size: Large) Comment: recheck done manual  Pulse 75   Temp 98.3 F (36.8 C) (Temporal)   Resp 18   Ht 5\' 5"  (1.651 m)   Wt 241 lb 9.6 oz (109.6 kg)   LMP 06/01/2023 (Exact Date)   SpO2 98%   Breastfeeding No   BMI 40.20 kg/m      Physical Exam Vitals and nursing note reviewed.  Cardiovascular:     Rate and Rhythm: Normal rate and regular rhythm.     Pulses: Normal pulses.     Heart sounds: Normal heart sounds.  Pulmonary:     Effort: Pulmonary effort is normal.     Breath sounds: Normal breath sounds.  Musculoskeletal:        General: No tenderness.     Right shoulder: Normal.     Right upper arm: Normal.     Cervical back: Normal, normal range of motion and neck supple.     Thoracic back: Normal.  Lymphadenopathy:     Cervical: No cervical adenopathy.  Neurological:     Mental Status: She is alert and oriented to person, place,  and time.     Results for orders placed or performed in visit on 09/11/23  Comprehensive metabolic panel  Result Value Ref Range   Sodium 138 135 - 145 mEq/L   Potassium 4.2 3.5 - 5.1 mEq/L   Chloride 102 96 - 112 mEq/L   CO2 26 19 - 32 mEq/L   Glucose, Bld 105 (H) 70 - 99 mg/dL   BUN 10 6 - 23 mg/dL   Creatinine, Ser 4.54 0.40 - 1.20 mg/dL   Total Bilirubin 0.4 0.2 - 1.2 mg/dL   Alkaline Phosphatase 99 39 - 117 U/L   AST 16 0 - 37 U/L   ALT 18 0 - 35 U/L   Total Protein 6.9 6.0 - 8.3 g/dL   Albumin 4.1 3.5 - 5.2 g/dL   GFR 09.81 >19.14 mL/min   Calcium 9.2 8.4 - 10.5 mg/dL  Hemoglobin N8G  Result Value Ref Range   Hgb A1c MFr Bld 6.0 4.6 - 6.5 %  TSH  Result Value Ref Range   TSH 3.06 0.35 - 5.50 uIU/mL   Lipid panel  Result Value Ref Range   Cholesterol 168 0 - 200 mg/dL   Triglycerides 956.2 0.0 - 149.0 mg/dL   HDL 13.08 (L) >65.78 mg/dL   VLDL 46.9 0.0 - 62.9 mg/dL   LDL Cholesterol 528 (H) 0 - 99 mg/dL   Total CHOL/HDL Ratio 5    NonHDL 134.22   VITAMIN D 25 Hydroxy (Vit-D Deficiency, Fractures)  Result Value Ref Range   VITD 20.95 (L) 30.00 - 100.00 ng/mL  Insulin, random  Result Value Ref Range   Insulin 19.6 (H) uIU/mL      Assessment & Plan:    Problem List Items Addressed This Visit     Amenorrhea    No acne, no hirsutism CT pelvic 2021: normal uterus and ovaries LMP 06/01/2023 Sexually active Check urine pregnancy today: unable to provide urine sample. She plans to return to lab Advised to f/up with GYN      Depression, recurrent (HCC)    Under the care of [facility] for therapy and [facility] for medication management      Elevated BP without diagnosis of hypertension    Advised to maintain DASH diet BP Readings from Last 3 Encounters:  09/11/23 (!) 140/94  08/25/22 128/86  07/14/22 118/80    F/up in 2weeks      Morbid obesity (HCC) - Primary    Exercise: no exercise Had 2 appts with nutritionist. States she has implemented suggestions from the nutritionist Wt Readings from Last 3 Encounters:  09/11/23 241 lb 9.6 oz (109.6 kg)  08/30/22 229 lb 9.6 oz (104.1 kg)  08/25/22 232 lb 3.2 oz (105.3 kg)    Check THYROID, hgbA1c, CMP, and lipid panel Entered referral to weight management clinic. Provided printed information on exercise      Relevant Orders   Amb Ref to Medical Weight Management   Comprehensive metabolic panel (Completed)   Hemoglobin A1c (Completed)   TSH (Completed)   Lipid panel (Completed)   Insulin, random (Completed)   Prediabetes    Repeat hgbA1c      Relevant Orders   Hemoglobin A1c (Completed)   Insulin, random (Completed)   Right anterior shoulder pain    onset 69month ago,  intermittent, pain last a few minutes and radiates to right upper chest, no trigger, no injury, she is right handed, use of tylenol 500mg  occassioanlly. No weakness, no paresthesia, no swelling.  Normal exam today Advised to use warm compress as needed and OVER THE COUNTER pain med prn.      Other Visit Diagnoses     Vitamin D insufficiency       Relevant Orders   VITAMIN D 25 Hydroxy (Vit-D Deficiency, Fractures) (Completed)   Encounter for lipid screening for cardiovascular disease       Relevant Orders   Lipid panel (Completed)      Return in about 2 weeks (around 09/25/2023) for HTN.     Alysia Penna, NP

## 2023-09-11 NOTE — Assessment & Plan Note (Signed)
onset 68month ago, intermittent, pain last a few minutes and radiates to right upper chest, no trigger, no injury, she is right handed, use of tylenol 500mg  occassioanlly. No weakness, no paresthesia, no swelling.  Normal exam today Advised to use warm compress as needed and OVER THE COUNTER pain med prn.

## 2023-09-11 NOTE — Patient Instructions (Addendum)
Go to lab maintain Heart healthy diet and daily exercise.you will be contacted to schedule appointment with weight management clinic.  DASH Eating Plan DASH stands for Dietary Approaches to Stop Hypertension. The DASH eating plan is a healthy eating plan that has been shown to: Lower high blood pressure (hypertension). Reduce your risk for type 2 diabetes, heart disease, and stroke. Help with weight loss. What are tips for following this plan? Reading food labels Check food labels for the amount of salt (sodium) per serving. Choose foods with less than 5 percent of the Daily Value (DV) of sodium. In general, foods with less than 300 milligrams (mg) of sodium per serving fit into this eating plan. To find whole grains, look for the word "whole" as the first word in the ingredient list. Shopping Buy products labeled as "low-sodium" or "no salt added." Buy fresh foods. Avoid canned foods and pre-made or frozen meals. Cooking Try not to add salt when you cook. Use salt-free seasonings or herbs instead of table salt or sea salt. Check with your health care provider or pharmacist before using salt substitutes. Do not fry foods. Cook foods in healthy ways, such as baking, boiling, grilling, roasting, or broiling. Cook using oils that are good for your heart. These include olive, canola, avocado, soybean, and sunflower oil. Meal planning  Eat a balanced diet. This should include: 4 or more servings of fruits and 4 or more servings of vegetables each day. Try to fill half of your plate with fruits and vegetables. 6-8 servings of whole grains each day. 6 or less servings of lean meat, poultry, or fish each day. 1 oz is 1 serving. A 3 oz (85 g) serving of meat is about the same size as the palm of your hand. One egg is 1 oz (28 g). 2-3 servings of low-fat dairy each day. One serving is 1 cup (237 mL). 1 serving of nuts, seeds, or beans 5 times each week. 2-3 servings of heart-healthy fats. Healthy  fats called omega-3 fatty acids are found in foods such as walnuts, flaxseeds, fortified milks, and eggs. These fats are also found in cold-water fish, such as sardines, salmon, and mackerel. Limit how much you eat of: Canned or prepackaged foods. Food that is high in trans fat, such as fried foods. Food that is high in saturated fat, such as fatty meat. Desserts and other sweets, sugary drinks, and other foods with added sugar. Full-fat dairy products. Do not salt foods before eating. Do not eat more than 4 egg yolks a week. Try to eat at least 2 vegetarian meals a week. Eat more home-cooked food and less restaurant, buffet, and fast food. Lifestyle When eating at a restaurant, ask if your food can be made with less salt or no salt. If you drink alcohol: Limit how much you have to: 0-1 drink a day if you are female. 0-2 drinks a day if you are female. Know how much alcohol is in your drink. In the [place] one drink is one 12 oz bottle of beer (355 mL), one 5 oz glass of wine (148 mL), or one 1 oz glass of hard liquor (44 mL). General information Avoid eating more than 2,300 mg of salt a day. If you have hypertension, you may need to reduce your sodium intake to 1,500 mg a day. Work with your provider to stay at a healthy body weight or lose weight. Ask what the [place] weight range is for you. On most days of the  week, get at least 30 minutes of exercise that causes your heart to beat faster. This may include walking, swimming, or biking. Work with your provider or dietitian to adjust your eating plan to meet your specific calorie needs. What foods should I eat? Fruits All fresh, dried, or frozen fruit. Canned fruits that are in their natural juice and do not have sugar added to them. Vegetables Fresh or frozen vegetables that are raw, steamed, roasted, or grilled. Low-sodium or reduced-sodium tomato and vegetable juice. Low-sodium or reduced-sodium tomato sauce and tomato paste. Low-sodium or  reduced-sodium canned vegetables. Grains Whole-grain or whole-wheat bread. Whole-grain or whole-wheat pasta. Brown rice. Orpah Cobb. Bulgur. Whole-grain and low-sodium cereals. Pita bread. Low-fat, low-sodium crackers. Whole-wheat flour tortillas. Meats and other proteins Skinless chicken or [place]. Ground chicken or [place]. Pork with fat trimmed off. Fish and seafood. Egg whites. Dried beans, peas, or lentils. Unsalted nuts, nut butters, and seeds. Unsalted canned beans. Lean cuts of beef with fat trimmed off. Low-sodium, lean precooked or cured meat, such as sausages or meat loaves. Dairy Low-fat (1%) or fat-free (skim) milk. Reduced-fat, low-fat, or fat-free cheeses. Nonfat, low-sodium ricotta or cottage cheese. Low-fat or nonfat yogurt. Low-fat, low-sodium cheese. Fats and oils Soft margarine without trans fats. Vegetable oil. Reduced-fat, low-fat, or light mayonnaise and salad dressings (reduced-sodium). Canola, safflower, olive, avocado, soybean, and sunflower oils. Avocado. Seasonings and condiments Herbs. Spices. Seasoning mixes without salt. Other foods Unsalted popcorn and pretzels. Fat-free sweets. The items listed above may not be all the foods and drinks you can have. Talk to a dietitian to learn more. What foods should I avoid? Fruits Canned fruit in a light or heavy syrup. Fried fruit. Fruit in cream or butter sauce. Vegetables Creamed or fried vegetables. Vegetables in a cheese sauce. Regular canned vegetables that are not marked as low-sodium or reduced-sodium. Regular canned tomato sauce and paste that are not marked as low-sodium or reduced-sodium. Regular tomato and vegetable juices that are not marked as low-sodium or reduced-sodium. Rosita Fire. Olives. Grains Baked goods made with fat, such as croissants, muffins, or some breads. Dry pasta or rice meal packs. Meats and other proteins Fatty cuts of meat. Ribs. Fried meat. Tomasa Blase. Bologna, salami, and other precooked or  cured meats, such as sausages or meat loaves, that are not lean and low in sodium. Fat from the back of a pig (fatback). Bratwurst. Salted nuts and seeds. Canned beans with added salt. Canned or smoked fish. Whole eggs or egg yolks. Chicken or [place] with skin. Dairy Whole or 2% milk, cream, and half-and-half. Whole or full-fat cream cheese. Whole-fat or sweetened yogurt. Full-fat cheese. Nondairy creamers. Whipped toppings. Processed cheese and cheese spreads. Fats and oils Butter. Stick margarine. Lard. Shortening. Ghee. Bacon fat. Tropical oils, such as coconut, palm kernel, or palm oil. Seasonings and condiments Onion salt, garlic salt, seasoned salt, table salt, and sea salt. Worcestershire sauce. Tartar sauce. Barbecue sauce. Teriyaki sauce. Soy sauce, including reduced-sodium soy sauce. Steak sauce. Canned and packaged gravies. Fish sauce. Oyster sauce. Cocktail sauce. Store-bought horseradish. Ketchup. Mustard. Meat flavorings and tenderizers. Bouillon cubes. Hot sauces. Pre-made or packaged marinades. Pre-made or packaged taco seasonings. Relishes. Regular salad dressings. Other foods Salted popcorn and pretzels. The items listed above may not be all the foods and drinks you should avoid. Talk to a dietitian to learn more. Where to find more information National Heart, Lung, and Blood Institute (NHLBI): BuffaloDryCleaner.gl American Heart Association (AHA): heart.org Academy of Nutrition and Dietetics: eatright.org National Kidney  Foundation (NKF): kidney.org This information is not intended to replace advice given to you by your health care provider. Make sure you discuss any questions you have with your health care provider. Document Revised: 10/26/2022 Document Reviewed: 10/26/2022 Elsevier Patient Education  2024 Elsevier Inc.   How to Increase Your Level of Physical Activity Getting regular physical activity is important for your overall health and well-being. Most people do not get  enough exercise. There are easy ways to increase your level of physical activity, even if you have not been very active in the past or if you are just starting out. What are the benefits of physical activity? Physical activity has many short-term and long-term benefits. Being active on a regular basis can improve your physical and mental health as well as provide other benefits. Physical health benefits Helping you lose weight or maintain a healthy weight. Strengthening your muscles and bones. Reducing your risk of certain long-term (chronic) diseases, including heart disease, cancer, and diabetes. Being able to move around more easily and for longer periods of time without getting tired (increased endurance or stamina). Improving your ability to fight off illness (enhanced immunity). Being able to sleep better. Helping you stay healthy as you get older, including: Helping you stay mobile, or capable of walking and moving around. Preventing accidents, such as falls. Increasing life expectancy. Mental health benefits Boosting your mood and improving your self-esteem. Lowering your chance of having mental health problems, such as depression or anxiety. Helping you feel good about your body. Other benefits Finding new sources of fun and enjoyment. Meeting new people who share a common interest. Before you begin If you have a chronic illness or have not been active for a while, check with your health care provider about how to get started. Ask your health care provider what activities are safe for you. Start out slowly. Walking or doing some simple chair exercises is a good place to start, especially if you have not been active before or for a long time. Set goals that you can work toward. Ask your health care provider how much exercise is [place] for you. In general, most adults should: Do moderate-intensity exercise for at least 150 minutes each week (30 minutes on most days of the week) or  vigorous exercise for at least 75 minutes each week, or a combination of these. Moderate-intensity exercise can include walking at a quick pace, biking, yoga, water aerobics, or gardening. Vigorous exercise involves activities that take more effort, such as jogging or running, playing sports, swimming laps, or jumping rope. Do strength exercises on at least 2 days each week. This can include weight lifting, body weight exercises, and resistance-band exercises. How to be more physically active Make a plan  Try to find activities that you enjoy. You are more likely to commit to an exercise routine if it does not feel like a chore. If you have bone or joint problems, choose low-impact exercises, like walking or swimming. Use these tips for being successful with an exercise plan: Find a workout partner for accountability. Join a group or class, such as an aerobics class, cycling class, or sports team. Make family time active. Go for a walk, bike, or swim. Include a variety of exercises each week. Consider using a fitness tracker, such as a mobile phone app or a device worn like a watch, that will count the number of steps you take each day. Many people strive to reach 10,000 steps a day. Find ways to be  active in your daily routines Besides your formal exercise plans, you can find ways to do physical activity during your daily routines, such as: Walking or biking to work or to the store. Taking the stairs instead of the elevator. Parking farther away from the door at work or at the store. Planning walking meetings. Walking around while you are on the phone. Where to find more information Centers for Disease Control and Prevention: CampusCasting.com.pt President's Council on Fitness, Sports & Nutrition: www.fitness.gov ChooseMyPlate: http://www.harvey.com/ Contact a health care provider if: You have headaches, muscle aches, or joint pain that is concerning. You feel dizzy or light-headed  while exercising. You faint. You feel your heart skipping, racing, or fluttering. You have chest pain while exercising. Summary Exercise benefits your mind and body at any age, even if you are just starting out. If you have a chronic illness or have not been active for a while, check with your health care provider before increasing your physical activity. Choose activities that are safe and enjoyable for you. Ask your health care provider what activities are safe for you. Start slowly. Tell your health care provider if you have problems as you start to increase your activity level. This information is not intended to replace advice given to you by your health care provider. Make sure you discuss any questions you have with your health care provider. Document Revised: 02/04/2021 Document Reviewed: 02/04/2021 Elsevier Patient Education  2024 [employer].

## 2023-09-11 NOTE — Assessment & Plan Note (Signed)
Advised to maintain DASH diet BP Readings from Last 3 Encounters:  09/11/23 (!) 140/94  08/25/22 128/86  07/14/22 118/80    F/up in 2weeks

## 2023-09-11 NOTE — Assessment & Plan Note (Signed)
Under the care of [facility] for therapy and [facility] for medication management

## 2023-09-12 LAB — INSULIN, RANDOM: Insulin: 19.6 u[IU]/mL — ABNORMAL HIGH

## 2023-09-23 DIAGNOSIS — Z419 Encounter for procedure for purposes other than remedying health state, unspecified: Secondary | ICD-10-CM | POA: Diagnosis not present

## 2023-09-26 ENCOUNTER — Encounter: Payer: Self-pay | Admitting: Nurse Practitioner

## 2023-09-26 ENCOUNTER — Ambulatory Visit: Payer: Medicaid Other | Admitting: Nurse Practitioner

## 2023-09-26 VITALS — BP 140/100 | HR 78 | Temp 98.5°F | Resp 19 | Ht 65.0 in | Wt 244.2 lb

## 2023-09-26 DIAGNOSIS — F321 Major depressive disorder, single episode, moderate: Secondary | ICD-10-CM | POA: Insufficient documentation

## 2023-09-26 DIAGNOSIS — Z8679 Personal history of other diseases of the circulatory system: Secondary | ICD-10-CM

## 2023-09-26 DIAGNOSIS — I10 Essential (primary) hypertension: Secondary | ICD-10-CM

## 2023-09-26 DIAGNOSIS — E88819 Insulin resistance, unspecified: Secondary | ICD-10-CM | POA: Diagnosis not present

## 2023-09-26 DIAGNOSIS — N912 Amenorrhea, unspecified: Secondary | ICD-10-CM | POA: Diagnosis not present

## 2023-09-26 DIAGNOSIS — Z905 Acquired absence of kidney: Secondary | ICD-10-CM

## 2023-09-26 DIAGNOSIS — Z8759 Personal history of other complications of pregnancy, childbirth and the puerperium: Secondary | ICD-10-CM | POA: Diagnosis not present

## 2023-09-26 DIAGNOSIS — R7303 Prediabetes: Secondary | ICD-10-CM

## 2023-09-26 LAB — POCT URINE PREGNANCY: Preg Test, Ur: NEGATIVE

## 2023-09-26 MED ORDER — METFORMIN HCL 500 MG PO TABS: 250.0000 mg | ORAL_TABLET | Freq: Two times a day (BID) | ORAL | 5 refills | Status: AC

## 2023-09-26 MED ORDER — AMLODIPINE BESYLATE 5 MG PO TABS: 5.0000 mg | ORAL_TABLET | Freq: Every evening | ORAL | 5 refills | Status: DC

## 2023-09-26 NOTE — Assessment & Plan Note (Addendum)
Persistent elevated BP. No change with dietary sodium BP Readings from Last 3 Encounters:  09/26/23 (!) 140/100  09/11/23 (!) 140/94  08/25/22 128/86   Hx of postpartum gestational HYPERTENSION. Treate with antihypertensive x 22month. 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 No evidence of hydronephrosis. Urinary bladder is normal. Normal CMP and THYROID.  Start amlodipine 5mg  in PM Advised to maintain a low salt diet Get renal US and duplex F/up in 22month

## 2023-09-26 NOTE — Assessment & Plan Note (Signed)
Amenorrhea, elevated testosterone and random insulin. Negative urine pregnancy No hirsutism Possible PCOS? Start metformin 250mg  BID F/up with GYN for amenorrhea

## 2023-09-26 NOTE — Patient Instructions (Addendum)
Start amlodipine in PM, metformin in Am and pm with food. Schedule appointment with GYN for further eval of amenorrhea and possible PCOS  DASH Eating Plan DASH stands for Dietary Approaches to Stop Hypertension. The DASH eating plan is a healthy eating plan that has been shown to: Lower high blood pressure (hypertension). Reduce your risk for type 2 diabetes, heart disease, and stroke. Help with weight loss. What are tips for following this plan? Reading food labels Check food labels for the amount of salt (sodium) per serving. Choose foods with less than 5 percent of the Daily Value (DV) of sodium. In general, foods with less than 300 milligrams (mg) of sodium per serving fit into this eating plan. To find whole grains, look for the word "whole" as the first word in the ingredient list. Shopping Buy products labeled as "low-sodium" or "no salt added." Buy fresh foods. Avoid canned foods and pre-made or frozen meals. Cooking Try not to add salt when you cook. Use salt-free seasonings or herbs instead of table salt or sea salt. Check with your health care provider or pharmacist before using salt substitutes. Do not fry foods. Cook foods in healthy ways, such as baking, boiling, grilling, roasting, or broiling. Cook using oils that are good for your heart. These include olive, canola, avocado, soybean, and sunflower oil. Meal planning  Eat a balanced diet. This should include: 4 or more servings of fruits and 4 or more servings of vegetables each day. Try to fill half of your plate with fruits and vegetables. 6-8 servings of whole grains each day. 6 or less servings of lean meat, poultry, or fish each day. 1 oz is 1 serving. A 3 oz (85 g) serving of meat is about the same size as the palm of your hand. One egg is 1 oz (28 g). 2-3 servings of low-fat dairy each day. One serving is 1 cup (237 mL). 1 serving of nuts, seeds, or beans 5 times each week. 2-3 servings of heart-healthy fats. Healthy  fats called omega-3 fatty acids are found in foods such as walnuts, flaxseeds, fortified milks, and eggs. These fats are also found in cold-water fish, such as sardines, salmon, and mackerel. Limit how much you eat of: Canned or prepackaged foods. Food that is high in trans fat, such as fried foods. Food that is high in saturated fat, such as fatty meat. Desserts and other sweets, sugary drinks, and other foods with added sugar. Full-fat dairy products. Do not salt foods before eating. Do not eat more than 4 egg yolks a week. Try to eat at least 2 vegetarian meals a week. Eat more home-cooked food and less restaurant, buffet, and fast food. Lifestyle When eating at a restaurant, ask if your food can be made with less salt or no salt. If you drink alcohol: Limit how much you have to: 0-1 drink a day if you are female. 0-2 drinks a day if you are female. Know how much alcohol is in your drink. In the [place] one drink is one 12 oz bottle of beer (355 mL), one 5 oz glass of wine (148 mL), or one 1 oz glass of hard liquor (44 mL). General information Avoid eating more than 2,300 mg of salt a day. If you have hypertension, you may need to reduce your sodium intake to 1,500 mg a day. Work with your provider to stay at a healthy body weight or lose weight. Ask what the [place] weight range is for you. On most  days of the week, get at least 30 minutes of exercise that causes your heart to beat faster. This may include walking, swimming, or biking. Work with your provider or dietitian to adjust your eating plan to meet your specific calorie needs. What foods should I eat? Fruits All fresh, dried, or frozen fruit. Canned fruits that are in their natural juice and do not have sugar added to them. Vegetables Fresh or frozen vegetables that are raw, steamed, roasted, or grilled. Low-sodium or reduced-sodium tomato and vegetable juice. Low-sodium or reduced-sodium tomato sauce and tomato paste. Low-sodium or  reduced-sodium canned vegetables. Grains Whole-grain or whole-wheat bread. Whole-grain or whole-wheat pasta. Brown rice. Orpah Cobb. Bulgur. Whole-grain and low-sodium cereals. Pita bread. Low-fat, low-sodium crackers. Whole-wheat flour tortillas. Meats and other proteins Skinless chicken or [place]. Ground chicken or [place]. Pork with fat trimmed off. Fish and seafood. Egg whites. Dried beans, peas, or lentils. Unsalted nuts, nut butters, and seeds. Unsalted canned beans. Lean cuts of beef with fat trimmed off. Low-sodium, lean precooked or cured meat, such as sausages or meat loaves. Dairy Low-fat (1%) or fat-free (skim) milk. Reduced-fat, low-fat, or fat-free cheeses. Nonfat, low-sodium ricotta or cottage cheese. Low-fat or nonfat yogurt. Low-fat, low-sodium cheese. Fats and oils Soft margarine without trans fats. Vegetable oil. Reduced-fat, low-fat, or light mayonnaise and salad dressings (reduced-sodium). Canola, safflower, olive, avocado, soybean, and sunflower oils. Avocado. Seasonings and condiments Herbs. Spices. Seasoning mixes without salt. Other foods Unsalted popcorn and pretzels. Fat-free sweets. The items listed above may not be all the foods and drinks you can have. Talk to a dietitian to learn more. What foods should I avoid? Fruits Canned fruit in a light or heavy syrup. Fried fruit. Fruit in cream or butter sauce. Vegetables Creamed or fried vegetables. Vegetables in a cheese sauce. Regular canned vegetables that are not marked as low-sodium or reduced-sodium. Regular canned tomato sauce and paste that are not marked as low-sodium or reduced-sodium. Regular tomato and vegetable juices that are not marked as low-sodium or reduced-sodium. Rosita Fire. Olives. Grains Baked goods made with fat, such as croissants, muffins, or some breads. Dry pasta or rice meal packs. Meats and other proteins Fatty cuts of meat. Ribs. Fried meat. Tomasa Blase. Bologna, salami, and other precooked or  cured meats, such as sausages or meat loaves, that are not lean and low in sodium. Fat from the back of a pig (fatback). Bratwurst. Salted nuts and seeds. Canned beans with added salt. Canned or smoked fish. Whole eggs or egg yolks. Chicken or [place] with skin. Dairy Whole or 2% milk, cream, and half-and-half. Whole or full-fat cream cheese. Whole-fat or sweetened yogurt. Full-fat cheese. Nondairy creamers. Whipped toppings. Processed cheese and cheese spreads. Fats and oils Butter. Stick margarine. Lard. Shortening. Ghee. Bacon fat. Tropical oils, such as coconut, palm kernel, or palm oil. Seasonings and condiments Onion salt, garlic salt, seasoned salt, table salt, and sea salt. Worcestershire sauce. Tartar sauce. Barbecue sauce. Teriyaki sauce. Soy sauce, including reduced-sodium soy sauce. Steak sauce. Canned and packaged gravies. Fish sauce. Oyster sauce. Cocktail sauce. Store-bought horseradish. Ketchup. Mustard. Meat flavorings and tenderizers. Bouillon cubes. Hot sauces. Pre-made or packaged marinades. Pre-made or packaged taco seasonings. Relishes. Regular salad dressings. Other foods Salted popcorn and pretzels. The items listed above may not be all the foods and drinks you should avoid. Talk to a dietitian to learn more. Where to find more information National Heart, Lung, and Blood Institute (NHLBI): BuffaloDryCleaner.gl American Heart Association (AHA): heart.org Academy of Nutrition and Dietetics:  eatright.org National Kidney Foundation (NKF): kidney.org This information is not intended to replace advice given to you by your health care provider. Make sure you discuss any questions you have with your health care provider. Document Revised: 10/26/2022 Document Reviewed: 10/26/2022 Elsevier Patient Education  2024 [employer].

## 2023-09-26 NOTE — Progress Notes (Signed)
Established Patient Visit  Patient: Jacqueline Peck   DOB: 20-Apr-1996   27 y.o. Female  MRN: [id number] Visit Date: 09/26/2023  Subjective:    Chief Complaint  Patient presents with   OFFICE VISIT     2 week follow up. PT is due for cervical screening    Hypertension   Hypertension   HTN (hypertension), benign Persistent elevated BP. No change with dietary sodium BP Readings from Last 3 Encounters:  09/26/23 (!) 140/100  09/11/23 (!) 140/94  08/25/22 128/86   Hx of postpartum gestational HYPERTENSION. Treate with antihypertensive x 44month. S/p right nephrectomy  Insulin resistance Amenorrhea, elevated testosterone and random insulin. Negative urine pregnancy No hirsutism Possible PCOS? Start metformin 250mg  BID F/up with GYN for amenorrhea  Reviewed medical, surgical, and social history today  Medications: Outpatient Medications Prior to Visit  Medication Sig   acetaminophen (TYLENOL) 325 MG tablet Take 650 mg by mouth every 6 (six) hours as needed.   pantoprazole (PROTONIX) 20 MG tablet TAKE 1 TABLET BY MOUTH EVERY DAY   No facility-administered medications prior to visit.   Reviewed past medical and social history.   ROS per HPI above  Last CBC Lab Results  Component Value Date   WBC 9.2 02/28/2022   HGB 13.6 02/28/2022   HCT 41.7 02/28/2022   MCV 84.6 02/28/2022   MCH 26.2 03/03/2020   RDW 14.8 02/28/2022   PLT 259.0 02/28/2022   Last metabolic panel Lab Results  Component Value Date   GLUCOSE 105 (H) 09/11/2023   NA 138 09/11/2023   K 4.2 09/11/2023   CL 102 09/11/2023   CO2 26 09/11/2023   BUN 10 09/11/2023   CREATININE 0.83 09/11/2023   GFR 95.92 09/11/2023   CALCIUM 9.2 09/11/2023   PROT 6.9 09/11/2023   ALBUMIN 4.1 09/11/2023   BILITOT 0.4 09/11/2023   ALKPHOS 99 09/11/2023   AST 16 09/11/2023   ALT 18 09/11/2023   ANIONGAP 9 03/03/2020   Last lipids Lab Results  Component Value Date   CHOL 168 09/11/2023   HDL 33.70  (L) 09/11/2023   LDLCALC 111 (H) 09/11/2023   TRIG 114.0 09/11/2023   CHOLHDL 5 09/11/2023   Last hemoglobin A1c Lab Results  Component Value Date   HGBA1C 6.0 09/11/2023   Last thyroid functions Lab Results  Component Value Date   TSH 3.06 09/11/2023      Objective:  BP (!) 140/100 (BP Location: Left Arm, Patient Position: Sitting, Cuff Size: Normal)   Pulse 78   Temp 98.5 F (36.9 C) (Temporal)   Resp 19   Ht 5\' 5"  (1.651 m)   Wt 244 lb 3.2 oz (110.8 kg)   LMP 06/11/2023 (Exact Date) Comment: 06/07/2023  SpO2 100%   BMI 40.64 kg/m      Physical Exam Cardiovascular:     Rate and Rhythm: Normal rate and regular rhythm.     Pulses: Normal pulses.     Heart sounds: Normal heart sounds.  Pulmonary:     Effort: Pulmonary effort is normal.     Breath sounds: Normal breath sounds.  Musculoskeletal:     Right lower leg: No edema.     Left lower leg: No edema.  Neurological:     Mental Status: She is alert and oriented to person, place, and time.     Results for orders placed or performed in visit on 09/26/23  POCT urine pregnancy  Result Value Ref Range   Preg Test, Ur Negative Negative      Assessment & Plan:    Problem List Items Addressed This Visit     Amenorrhea   Relevant Orders   POCT urine pregnancy (Completed)   History of postpartum gestational hypertension   Relevant Medications   amLODipine (NORVASC) 5 MG tablet   HTN (hypertension), benign - Primary    Persistent elevated BP. No change with dietary sodium BP Readings from Last 3 Encounters:  09/26/23 (!) 140/100  09/11/23 (!) 140/94  08/25/22 128/86   Hx of postpartum gestational HYPERTENSION. Treate with antihypertensive x 86month. S/p right nephrectomy      Relevant Medications   amLODipine (NORVASC) 5 MG tablet   Insulin resistance    Amenorrhea, elevated testosterone and random insulin. Negative urine pregnancy No hirsutism Possible PCOS? Start metformin 250mg  BID F/up with GYN  for amenorrhea      Relevant Medications   metFORMIN (GLUCOPHAGE) 500 MG tablet   Prediabetes   Relevant Medications   metFORMIN (GLUCOPHAGE) 500 MG tablet   S/p nephrectomy   Return in about 4 weeks (around 10/24/2023) for HTN.     Alysia Penna, NP

## 2023-10-19 ENCOUNTER — Other Ambulatory Visit: Payer: Medicaid Other

## 2023-10-23 ENCOUNTER — Other Ambulatory Visit: Payer: Medicaid Other

## 2023-10-24 DIAGNOSIS — Z419 Encounter for procedure for purposes other than remedying health state, unspecified: Secondary | ICD-10-CM | POA: Diagnosis not present

## 2023-10-25 ENCOUNTER — Telehealth: Payer: Self-pay | Admitting: Nurse Practitioner

## 2023-10-25 NOTE — Telephone Encounter (Signed)
 Pt's phone not working, I will sent a mychart message to find out what symptoms she is having, not to feel well. It says she is to be seen for HTN.

## 2023-10-25 NOTE — Telephone Encounter (Signed)
 Copied from CRM [id number]. Topic: Appointments - Scheduling Inquiry for Clinic >> Oct 25, 2023  1:13 PM Curlee DEL wrote: Reason for CRM: Patient has not been feeling well - they have an appointment tomorrow and they'd like to see if they can convert that to a virtual visit.

## 2023-10-26 ENCOUNTER — Ambulatory Visit (INDEPENDENT_AMBULATORY_CARE_PROVIDER_SITE_OTHER): Payer: Medicaid Other | Admitting: Nurse Practitioner

## 2023-10-26 ENCOUNTER — Encounter: Payer: Self-pay | Admitting: Nurse Practitioner

## 2023-10-26 VITALS — BP 160/112 | HR 81 | Temp 97.6°F | Resp 18 | Wt 241.2 lb

## 2023-10-26 DIAGNOSIS — J0140 Acute pansinusitis, unspecified: Secondary | ICD-10-CM

## 2023-10-26 DIAGNOSIS — I10 Essential (primary) hypertension: Secondary | ICD-10-CM | POA: Diagnosis not present

## 2023-10-26 DIAGNOSIS — J209 Acute bronchitis, unspecified: Secondary | ICD-10-CM | POA: Diagnosis not present

## 2023-10-26 LAB — POCT INFLUENZA A/B
Influenza A, POC: NEGATIVE
Influenza B, POC: NEGATIVE

## 2023-10-26 LAB — POC COVID19 BINAXNOW: SARS Coronavirus 2 Ag: NEGATIVE

## 2023-10-26 MED ORDER — BENZONATATE 200 MG PO CAPS: 200.0000 mg | ORAL_CAPSULE | Freq: Three times a day (TID) | ORAL | 0 refills | Status: DC | PRN

## 2023-10-26 MED ORDER — AZITHROMYCIN 250 MG PO TABS: 250.0000 mg | ORAL_TABLET | Freq: Every day | ORAL | 0 refills | Status: DC

## 2023-10-26 NOTE — Patient Instructions (Addendum)
 URI Instructions: Flonase and Afrin use: apply 1spray of afrin in each nare, wait , then apply 2sprays of flonase in each nare. Use both nasal spray consecutively x 3days, then flonase only for at least 14days  Encourage adequate oral hydration.  Avoid decongestants due to high blood pressure. Ok to use Coricidin HBP for chest and sinus congestion Ok to use benzonatate  and mucinex DM or Robitussin  or delsym for cough without sinus congestion  You can use plain Tylenol  or Advil  for fever, chills and achyness. Use cool mist humidifier at bedtime to help with nasal congestion and cough.  Take BP medication daily Maintain low salt diet

## 2023-10-26 NOTE — Assessment & Plan Note (Signed)
 Uncontrolled BP due to med and diet non compliance. She also took OVER THE COUNTER decongestant due to acute URI BP Readings from Last 3 Encounters:  10/26/23 (!) 160/112  09/26/23 (!) 140/100  09/11/23 (!) 140/94    Advised to avoid decongestants Advised about the adverse effects of uncontrolled HYPERTENSION. Advised to take amlodipine  5mg  as prescribed and maintain DASh diet F/up in 63month

## 2023-10-26 NOTE — Progress Notes (Signed)
 Established Patient Visit  Patient: Jacqueline Peck   DOB: 17-Sep-1996   28 y.o. Female  MRN: [id number] Visit Date: 10/26/2023  Subjective:    Chief Complaint  Patient presents with  . Medical Management of Chronic Issues    HYPERTENSION f/up PT C/O of fatigue, headaches, nasal congestion, cough since Sunday. PT took Tylenol  and dayquil with goody powders for symptoms      URI  This is a new problem. The current episode started 1 to 4 weeks ago. The problem has been waxing and waning. There has been no fever. Associated symptoms include congestion, coughing, headaches, joint pain, rhinorrhea, sinus pain and sneezing. Pertinent negatives include no chest pain, diarrhea, dysuria, ear pain, joint swelling, nausea, neck pain, plugged ear sensation, rash, sore throat, swollen glands, vomiting or wheezing. She has tried decongestant and acetaminophen  for the symptoms. The treatment provided no relief.   HTN (hypertension), benign Uncontrolled BP due to med and diet non compliance. She also took OVER THE COUNTER decongestant due to acute URI BP Readings from Last 3 Encounters:  10/26/23 (!) 160/112  09/26/23 (!) 140/100  09/11/23 (!) 140/94    Advised to avoid decongestants Advised about the adverse effects of uncontrolled HYPERTENSION. Advised to take amlodipine  5mg  as prescribed and maintain DASh diet F/up in 53month  Reviewed medical, surgical, and social history today  Medications: Outpatient Medications Prior to Visit  Medication Sig  . acetaminophen  (TYLENOL ) 325 MG tablet Take 650 mg by mouth every 6 (six) hours as needed.  . amLODipine  (NORVASC ) 5 MG tablet Take 1 tablet (5 mg total) by mouth every evening.  . metFORMIN  (GLUCOPHAGE ) 500 MG tablet Take 0.5 tablets (250 mg total) by mouth 2 (two) times daily with a meal.  . pantoprazole  (PROTONIX ) 20 MG tablet TAKE 1 TABLET BY MOUTH EVERY DAY   No facility-administered medications prior to visit.   Reviewed past  medical and social history.   ROS per HPI above      Objective:  BP (!) 160/112 (BP Location: Left Arm, Patient Position: Sitting, Cuff Size: Large)   Pulse 81   Temp 97.6 F (36.4 C) (Oral)   Resp 18   Wt 241 lb 3.2 oz (109.4 kg)   LMP 06/11/2023 (Exact Date) Comment: 06/07/2023  SpO2 100%   BMI 40.14 kg/m      Physical Exam Vitals and nursing note reviewed.  HENT:     Nose:     Right Turbinates: Enlarged and swollen.     Left Turbinates: Enlarged and swollen.     Right Sinus: Maxillary sinus tenderness and frontal sinus tenderness present.     Left Sinus: Maxillary sinus tenderness and frontal sinus tenderness present.  Cardiovascular:     Rate and Rhythm: Normal rate and regular rhythm.     Pulses: Normal pulses.     Heart sounds: Normal heart sounds.  Pulmonary:     Effort: Pulmonary effort is normal.     Breath sounds: Normal breath sounds.  Neurological:     Mental Status: She is alert and oriented to person, place, and time.    Results for orders placed or performed in visit on 10/26/23  POC COVID-19  Result Value Ref Range   SARS Coronavirus 2 Ag Negative Negative  POCT Influenza A/B  Result Value Ref Range   Influenza A, POC Negative Negative   Influenza B, POC Negative Negative  Assessment & Plan:    Problem List Items Addressed This Visit     HTN (hypertension), benign - Primary   Uncontrolled BP due to med and diet non compliance. She also took OVER THE COUNTER decongestant due to acute URI BP Readings from Last 3 Encounters:  10/26/23 (!) 160/112  09/26/23 (!) 140/100  09/11/23 (!) 140/94    Advised to avoid decongestants Advised about the adverse effects of uncontrolled HYPERTENSION. Advised to take amlodipine  5mg  as prescribed and maintain DASh diet F/up in 35month      Other Visit Diagnoses       Acute bronchitis, unspecified organism       Relevant Medications   azithromycin  (ZITHROMAX  Z-PAK) 250 MG tablet   benzonatate   (TESSALON ) 200 MG capsule   Other Relevant Orders   POC COVID-19 (Completed)   POCT Influenza A/B (Completed)     Acute non-recurrent pansinusitis       Relevant Medications   azithromycin  (ZITHROMAX  Z-PAK) 250 MG tablet   benzonatate  (TESSALON ) 200 MG capsule      Return in about 4 weeks (around 11/23/2023) for HTN.     Roselie Mood, NP

## 2023-11-06 ENCOUNTER — Ambulatory Visit: Payer: Medicaid Other | Admitting: Family Medicine

## 2023-11-06 ENCOUNTER — Ambulatory Visit (INDEPENDENT_AMBULATORY_CARE_PROVIDER_SITE_OTHER): Payer: Medicaid Other | Admitting: Nurse Practitioner

## 2023-11-06 ENCOUNTER — Ambulatory Visit (INDEPENDENT_AMBULATORY_CARE_PROVIDER_SITE_OTHER)
Admission: RE | Admit: 2023-11-06 | Discharge: 2023-11-06 | Disposition: A | Discharge status: Home / Self Care | Payer: Medicaid Other | Source: Ambulatory Visit | Attending: Nurse Practitioner | Admitting: Nurse Practitioner

## 2023-11-06 VITALS — BP 140/80 | HR 95 | Temp 98.7°F | Resp 18 | Wt 238.0 lb

## 2023-11-06 DIAGNOSIS — N912 Amenorrhea, unspecified: Secondary | ICD-10-CM | POA: Diagnosis not present

## 2023-11-06 DIAGNOSIS — R053 Chronic cough: Secondary | ICD-10-CM | POA: Diagnosis not present

## 2023-11-06 DIAGNOSIS — J209 Acute bronchitis, unspecified: Secondary | ICD-10-CM | POA: Diagnosis not present

## 2023-11-06 DIAGNOSIS — R0989 Other specified symptoms and signs involving the circulatory and respiratory systems: Secondary | ICD-10-CM | POA: Diagnosis not present

## 2023-11-06 DIAGNOSIS — I10 Essential (primary) hypertension: Secondary | ICD-10-CM

## 2023-11-06 LAB — POCT URINE PREGNANCY: Preg Test, Ur: NEGATIVE

## 2023-11-06 MED ORDER — ALBUTEROL SULFATE (2.5 MG/3ML) 0.083% IN NEBU: 2.5000 mg | INHALATION_SOLUTION | Freq: Once | RESPIRATORY_TRACT | Status: AC

## 2023-11-06 MED ORDER — PROMETHAZINE-DM 6.25-15 MG/5ML PO SYRP: 5.0000 mL | ORAL_SOLUTION | Freq: Three times a day (TID) | ORAL | 0 refills | Status: AC | PRN

## 2023-11-06 MED ORDER — AMLODIPINE BESYLATE 10 MG PO TABS: 10.0000 mg | ORAL_TABLET | Freq: Every evening | ORAL | 1 refills | Status: DC

## 2023-11-06 MED ORDER — ALBUTEROL SULFATE HFA 108 (90 BASE) MCG/ACT IN AERS: 1.0000 | INHALATION_SPRAY | Freq: Four times a day (QID) | RESPIRATORY_TRACT | 0 refills | Status: AC | PRN

## 2023-11-06 MED ORDER — PREDNISONE 10 MG (21) PO TBPK: ORAL_TABLET | ORAL | 0 refills | Status: AC

## 2023-11-06 NOTE — Patient Instructions (Signed)
 Negative urine pregnancy Go to [address] for CXR Increase amlodipine to 10mg  daily

## 2023-11-06 NOTE — Assessment & Plan Note (Signed)
 BP not at goal due to diet and med non compliance BP Readings from Last 3 Encounters:  11/06/23 (!) 140/80  10/26/23 (!) 160/112  09/26/23 (!) 140/100    Increase amlodipine  to 10mg  Advised about possible adverse effects of uncontrolled HYPERTENSION. F/up as scheduled 11/23/2023

## 2023-11-06 NOTE — Progress Notes (Signed)
 Acute Office Visit  Subjective:    Patient ID: Jacqueline Peck, female    DOB: 20-Aug-1996, 28 y.o.   MRN: [id number]  Chief Complaint  Patient presents with   Follow-up    PT C/O of cough and congestion; symptoms hasn't improved, became worse Saturday night. PT used Tessalon  for cough but mentioned it didn't help much; excused work note is needed    Cough This is a recurrent problem. The current episode started 1 to 4 weeks ago. The problem has been waxing and waning. The problem occurs constantly. The cough is Non-productive. Associated symptoms include chest pain, chills, nasal congestion, postnasal drip, rhinorrhea and shortness of breath. Pertinent negatives include no ear congestion, ear pain, fever, headaches, heartburn, hemoptysis, myalgias, rash, sore throat, sweats, weight loss or wheezing. The symptoms are aggravated by lying down. Risk factors for lung disease include smoking/tobacco exposure. She has tried OTC cough suppressant and prescription cough suppressant (azithromycin ) for the symptoms. The treatment provided mild relief. Her past medical history is significant for bronchitis. There is no history of environmental allergies.   HTN (hypertension), benign BP not at goal due to diet and med non compliance BP Readings from Last 3 Encounters:  11/06/23 (!) 140/80  10/26/23 (!) 160/112  09/26/23 (!) 140/100    Increase amlodipine  to 10mg  Advised about possible adverse effects of uncontrolled HYPERTENSION. F/up as scheduled 11/23/2023    Outpatient Medications Prior to Visit  Medication Sig Note   acetaminophen  (TYLENOL ) 325 MG tablet Take 650 mg by mouth every 6 (six) hours as needed.    metFORMIN  (GLUCOPHAGE ) 500 MG tablet Take 0.5 tablets (250 mg total) by mouth 2 (two) times daily with a meal.    pantoprazole  (PROTONIX ) 20 MG tablet TAKE 1 TABLET BY MOUTH EVERY DAY    [DISCONTINUED] amLODipine  (NORVASC ) 5 MG tablet Take 1 tablet (5 mg total) by mouth every evening.     [DISCONTINUED] azithromycin  (ZITHROMAX  Z-PAK) 250 MG tablet Take 1 tablet (250 mg total) by mouth daily. Take 2tabs on first day, then 1tab once a day till complete    [DISCONTINUED] benzonatate  (TESSALON ) 200 MG capsule Take 1 capsule (200 mg total) by mouth 3 (three) times daily as needed. 11/06/2023: Not helping    No facility-administered medications prior to visit.    Reviewed past medical and social history.  Review of Systems  Constitutional:  Positive for chills. Negative for fever and weight loss.  HENT:  Positive for postnasal drip and rhinorrhea. Negative for ear pain and sore throat.   Respiratory:  Positive for cough and shortness of breath. Negative for hemoptysis and wheezing.   Cardiovascular:  Positive for chest pain.  Gastrointestinal:  Negative for heartburn.  Musculoskeletal:  Negative for myalgias.  Skin:  Negative for rash.  Allergic/Immunologic: Negative for environmental allergies.  Neurological:  Negative for headaches.   Per HPI     Objective:    Physical Exam Vitals and nursing note reviewed.  Cardiovascular:     Rate and Rhythm: Normal rate and regular rhythm.     Pulses: Normal pulses.     Heart sounds: Normal heart sounds.  Pulmonary:     Effort: Pulmonary effort is normal. No respiratory distress.     Breath sounds: Wheezing present. No rhonchi.  Musculoskeletal:     Right lower leg: No edema.     Left lower leg: No edema.  Neurological:     Mental Status: She is alert and oriented to person, place, and time.  BP (!) 140/80 (BP Location: Right Arm, Patient Position: Sitting, Cuff Size: Normal)   Pulse 95   Temp 98.7 F (37.1 C) (Temporal)   Resp 18   Wt 238 lb (108 kg)   SpO2 98%   BMI 39.61 kg/m    Results for orders placed or performed in visit on 11/06/23  POCT urine pregnancy  Result Value Ref Range   Preg Test, Ur Negative Negative      Assessment & Plan:   Problem List Items Addressed This Visit     Amenorrhea    Relevant Orders   POCT urine pregnancy (Completed)   HTN (hypertension), benign   BP not at goal due to diet and med non compliance BP Readings from Last 3 Encounters:  11/06/23 (!) 140/80  10/26/23 (!) 160/112  09/26/23 (!) 140/100    Increase amlodipine  to 10mg  Advised about possible adverse effects of uncontrolled HYPERTENSION. F/up as scheduled 11/23/2023      Relevant Medications   amLODipine  (NORVASC ) 10 MG tablet   Other Visit Diagnoses       Acute bronchitis, unspecified organism    -  Primary   Relevant Medications   albuterol  (PROVENTIL ) (2.5 MG/3ML) 0.083% nebulizer solution 2.5 mg   predniSONE  (STERAPRED UNI-PAK 21 TAB) 10 MG (21) TBPK tablet   promethazine -dextromethorphan (PROMETHAZINE -DM) 6.25-15 MG/5ML syrup   albuterol  (VENTOLIN  HFA) 108 (90 Base) MCG/ACT inhaler   Other Relevant Orders   DG Chest 2 View      Meds ordered this encounter  Medications   albuterol  (PROVENTIL ) (2.5 MG/3ML) 0.083% nebulizer solution 2.5 mg   predniSONE  (STERAPRED UNI-PAK 21 TAB) 10 MG (21) TBPK tablet    Sig: As directed on package    Dispense:  21 tablet    Refill:  0    Supervising Provider:   BERNETA FALLOW ALFRED [[id number]]   promethazine -dextromethorphan (PROMETHAZINE -DM) 6.25-15 MG/5ML syrup    Sig: Take 5 mLs by mouth 3 (three) times daily as needed for cough.    Dispense:  180 mL    Refill:  0    Supervising Provider:   BERNETA FALLOW ALFRED [[id number]]   albuterol  (VENTOLIN  HFA) 108 (90 Base) MCG/ACT inhaler    Sig: Inhale 1-2 puffs into the lungs every 6 (six) hours as needed.    Dispense:  8 g    Refill:  0    Supervising Provider:   BERNETA FALLOW ALFRED [[id number]]   amLODipine  (NORVASC ) 10 MG tablet    Sig: Take 1 tablet (10 mg total) by mouth every evening.    Dispense:  90 tablet    Refill:  1    Change in dose    Supervising Provider:   BERNETA FALLOW SAYRE [[id number]]   Return for maintain upcomign appt.  Roselie Mood, NP

## 2023-11-08 ENCOUNTER — Encounter: Payer: Medicaid Other | Admitting: Family Medicine

## 2023-11-20 ENCOUNTER — Encounter: Payer: Medicaid Other | Admitting: Family Medicine

## 2023-11-22 ENCOUNTER — Ambulatory Visit (HOSPITAL_COMMUNITY): Admission: RE | Admit: 2023-11-22 | Payer: Medicaid Other | Source: Ambulatory Visit

## 2023-11-23 ENCOUNTER — Ambulatory Visit (INDEPENDENT_AMBULATORY_CARE_PROVIDER_SITE_OTHER): Payer: Medicaid Other | Admitting: Nurse Practitioner

## 2023-11-23 ENCOUNTER — Encounter: Payer: Self-pay | Admitting: Nurse Practitioner

## 2023-11-23 VITALS — BP 130/80 | HR 70 | Temp 97.8°F | Resp 18 | Wt 246.6 lb

## 2023-11-23 DIAGNOSIS — I10 Essential (primary) hypertension: Secondary | ICD-10-CM

## 2023-11-23 NOTE — Assessment & Plan Note (Signed)
Advised to incorporate daily exercise and DASH diet  Wt Readings from Last 3 Encounters:  11/23/23 246 lb 9.6 oz (111.9 kg)  11/06/23 238 lb (108 kg)  10/26/23 241 lb 3.2 oz (109.4 kg)

## 2023-11-23 NOTE — Patient Instructions (Signed)
 Maintain Heart healthy diet and daily exercise. Maintain current medications.  How to Increase Your Level of Physical Activity Getting regular physical activity is important for your overall health and well-being. Most people do not get enough exercise. There are easy ways to increase your level of physical activity, even if you have not been very active in the past or if you are just starting out. What are the benefits of physical activity? Physical activity has many short-term and long-term benefits. Being active on a regular basis can improve your physical and mental health as well as provide other benefits. Physical health benefits Helping you lose weight or maintain a healthy weight. Strengthening your muscles and bones. Reducing your risk of certain long-term (chronic) diseases, including heart disease, cancer, and diabetes. Being able to move around more easily and for longer periods of time without getting tired (increased endurance or stamina). Improving your ability to fight off illness (enhanced immunity). Being able to sleep better. Helping you stay healthy as you get older, including: Helping you stay mobile, or capable of walking and moving around. Preventing accidents, such as falls. Increasing life expectancy. Mental health benefits Boosting your mood and improving your self-esteem. Lowering your chance of having mental health problems, such as depression or anxiety. Helping you feel good about your body. Other benefits Finding new sources of fun and enjoyment. Meeting new people who share a common interest. Before you begin If you have a chronic illness or have not been active for a while, check with your health care provider about how to get started. Ask your health care provider what activities are safe for you. Start out slowly. Walking or doing some simple chair exercises is a good place to start, especially if you have not been active before or for a long time. Set  goals that you can work toward. Ask your health care provider how much exercise is best for you. In general, most adults should: Do moderate-intensity exercise for at least 150 minutes each week (30 minutes on most days of the week) or vigorous exercise for at least 75 minutes each week, or a combination of these. Moderate-intensity exercise can include walking at a quick pace, biking, yoga, water aerobics, or gardening. Vigorous exercise involves activities that take more effort, such as jogging or running, playing sports, swimming laps, or jumping rope. Do strength exercises on at least 2 days each week. This can include weight lifting, body weight exercises, and resistance-band exercises. How to be more physically active Make a plan  Try to find activities that you enjoy. You are more likely to commit to an exercise routine if it does not feel like a chore. If you have bone or joint problems, choose low-impact exercises, like walking or swimming. Use these tips for being successful with an exercise plan: Find a workout partner for accountability. Join a group or class, such as an aerobics class, cycling class, or sports team. Make family time active. Go for a walk, bike, or swim. Include a variety of exercises each week. Consider using a fitness tracker, such as a mobile phone app or a device worn like a watch, that will count the number of steps you take each day. Many people strive to reach 10,000 steps a day. Find ways to be active in your daily routines Besides your formal exercise plans, you can find ways to do physical activity during your daily routines, such as: Walking or biking to work or to the store. Taking the stairs instead  of the elevator. Parking farther away from the door at work or at the store. Planning walking meetings. Walking around while you are on the phone. Where to find more information Centers for Disease Control and Prevention:  [employer] on Fitness, Sports & Nutrition: www.fitness.gov ChooseMyPlate: http://www.harvey.com/ Contact a health care provider if: You have headaches, muscle aches, or joint pain that is concerning. You feel dizzy or light-headed while exercising. You faint. You feel your heart skipping, racing, or fluttering. You have chest pain while exercising. Summary Exercise benefits your mind and body at any age, even if you are just starting out. If you have a chronic illness or have not been active for a while, check with your health care provider before increasing your physical activity. Choose activities that are safe and enjoyable for you. Ask your health care provider what activities are safe for you. Start slowly. Tell your health care provider if you have problems as you start to increase your activity level. This information is not intended to replace advice given to you by your health care provider. Make sure you discuss any questions you have with your health care provider. Document Revised: 02/04/2021 Document Reviewed: 02/04/2021 Elsevier Patient Education  2024 [employer].

## 2023-11-23 NOTE — Assessment & Plan Note (Signed)
Improved BP control with med compliance No adverse effects BP Readings from Last 3 Encounters:  11/23/23 130/80  11/06/23 (!) 140/80  10/26/23 (!) 160/112    Maintain med dose F/up in 3-54months

## 2023-11-23 NOTE — Progress Notes (Signed)
Established Patient Visit  Patient: Jacqueline Peck   DOB: Mar 02, 1996   28 y.o. Female  MRN: [id number] Visit Date: 11/23/2023  Subjective:    Chief Complaint  Patient presents with   Hypertension    1 month follow up. PT C/O of ongoing cough; cervical screening is due    Hypertension   HTN (hypertension), benign Improved BP control with med compliance No adverse effects BP Readings from Last 3 Encounters:  11/23/23 130/80  11/06/23 (!) 140/80  10/26/23 (!) 160/112    Maintain med dose F/up in 3-29months  Morbid obesity (HCC) Advised to incorporate daily exercise and DASH diet  Wt Readings from Last 3 Encounters:  11/23/23 246 lb 9.6 oz (111.9 kg)  11/06/23 238 lb (108 kg)  10/26/23 241 lb 3.2 oz (109.4 kg)     Reviewed medical, surgical, and social history today  Medications: Outpatient Medications Prior to Visit  Medication Sig   acetaminophen (TYLENOL) 325 MG tablet Take 650 mg by mouth every 6 (six) hours as needed.   albuterol (VENTOLIN HFA) 108 (90 Base) MCG/ACT inhaler Inhale 1-2 puffs into the lungs every 6 (six) hours as needed.   amLODipine (NORVASC) 10 MG tablet Take 1 tablet (10 mg total) by mouth every evening.   metFORMIN (GLUCOPHAGE) 500 MG tablet Take 0.5 tablets (250 mg total) by mouth 2 (two) times daily with a meal.   pantoprazole (PROTONIX) 20 MG tablet TAKE 1 TABLET BY MOUTH EVERY DAY   promethazine-dextromethorphan (PROMETHAZINE-DM) 6.25-15 MG/5ML syrup Take 5 mLs by mouth 3 (three) times daily as needed for cough.   predniSONE (STERAPRED UNI-PAK 21 TAB) 10 MG (21) TBPK tablet As directed on package (Patient not taking: Reported on 11/23/2023)   Facility-Administered Medications Prior to Visit  Medication Dose Route Frequency Provider   albuterol (PROVENTIL) (2.5 MG/3ML) 0.083% nebulizer solution 2.5 mg  2.5 mg Nebulization Once    Reviewed past medical and social history.   ROS per HPI above      Objective:  BP 130/80 (BP  Location: Right Arm, Patient Position: Sitting, Cuff Size: Large) Comment: recheck  Pulse 70   Temp 97.8 F (36.6 C) (Temporal)   Resp 18   Wt 246 lb 9.6 oz (111.9 kg)   LMP 11/07/2023 (Approximate)   SpO2 100%   BMI 41.04 kg/m      Physical Exam Cardiovascular:     Rate and Rhythm: Normal rate and regular rhythm.     Pulses: Normal pulses.     Heart sounds: Normal heart sounds.  Pulmonary:     Effort: Pulmonary effort is normal.     Breath sounds: Normal breath sounds.  Musculoskeletal:     Right lower leg: No edema.     Left lower leg: No edema.  Neurological:     Mental Status: She is alert and oriented to person, place, and time.     No results found for any visits on 11/23/23.    Assessment & Plan:    Problem List Items Addressed This Visit     HTN (hypertension), benign - Primary   Improved BP control with med compliance No adverse effects BP Readings from Last 3 Encounters:  11/23/23 130/80  11/06/23 (!) 140/80  10/26/23 (!) 160/112    Maintain med dose F/up in 3-29months      Morbid obesity (HCC)   Advised to incorporate daily exercise and DASH diet  Wt Readings from  Last 3 Encounters:  11/23/23 246 lb 9.6 oz (111.9 kg)  11/06/23 238 lb (108 kg)  10/26/23 241 lb 3.2 oz (109.4 kg)         Return in about 6 months (around 05/22/2024) for CPE (fasting).     Alysia Penna, NP

## 2023-11-24 DIAGNOSIS — Z419 Encounter for procedure for purposes other than remedying health state, unspecified: Secondary | ICD-10-CM | POA: Diagnosis not present

## 2023-11-26 ENCOUNTER — Other Ambulatory Visit: Payer: Medicaid Other

## 2023-11-26 ENCOUNTER — Telehealth: Payer: Self-pay

## 2023-11-26 NOTE — Telephone Encounter (Signed)
Copied from CRM [id number]. Topic: General - Other >> Nov 26, 2023 12:03 PM Almira Coaster wrote: Reason for CRM: [facility] is calling because patient is scheduled for a Renal ultrasound today at 1:45 pm and they need clarification on the order. Their call back number is [phone] option 1 then option 5  Spoke to Delta from [facility] and patient was rescheduled due to order that was placed at [facility] on 12/06/2023; she will go to a different location to have US renal performed.

## 2023-11-26 NOTE — Telephone Encounter (Signed)
I spoke with Joyce Gross with [facility] and she needed clarification on renal ultrasound order that was placed on 09/26/2023.  Per Alysia Penna, NP she wants patient to have a renal duplex ultrasound that will take 90 minutes per Joyce Gross. Patient is scheduled for this on 12/06/23 and will cancel regular renal ultrasound that was scheduled for today.

## 2023-12-03 ENCOUNTER — Encounter: Payer: Medicaid Other | Admitting: Family Medicine

## 2023-12-06 ENCOUNTER — Ambulatory Visit (HOSPITAL_COMMUNITY)
Admission: RE | Admit: 2023-12-06 | Payer: Medicaid Other | Source: Ambulatory Visit | Attending: Nurse Practitioner | Admitting: Nurse Practitioner

## 2023-12-22 DIAGNOSIS — Z419 Encounter for procedure for purposes other than remedying health state, unspecified: Secondary | ICD-10-CM | POA: Diagnosis not present

## 2024-01-11 ENCOUNTER — Encounter: Payer: Self-pay | Admitting: Nurse Practitioner

## 2024-01-11 ENCOUNTER — Ambulatory Visit (HOSPITAL_COMMUNITY)
Admission: RE | Admit: 2024-01-11 | Discharge: 2024-01-11 | Disposition: A | Discharge status: Home / Self Care | Payer: Medicaid Other | Source: Ambulatory Visit | Attending: Nurse Practitioner | Admitting: Nurse Practitioner

## 2024-01-11 DIAGNOSIS — I10 Essential (primary) hypertension: Secondary | ICD-10-CM | POA: Diagnosis not present

## 2024-01-11 DIAGNOSIS — Z905 Acquired absence of kidney: Secondary | ICD-10-CM

## 2024-01-11 DIAGNOSIS — Z8759 Personal history of other complications of pregnancy, childbirth and the puerperium: Secondary | ICD-10-CM

## 2024-01-11 DIAGNOSIS — Z8679 Personal history of other diseases of the circulatory system: Secondary | ICD-10-CM | POA: Insufficient documentation

## 2024-02-02 DIAGNOSIS — Z419 Encounter for procedure for purposes other than remedying health state, unspecified: Secondary | ICD-10-CM | POA: Diagnosis not present

## 2024-03-03 ENCOUNTER — Other Ambulatory Visit: Payer: Self-pay | Admitting: Nurse Practitioner

## 2024-03-03 DIAGNOSIS — K219 Gastro-esophageal reflux disease without esophagitis: Secondary | ICD-10-CM

## 2024-03-03 DIAGNOSIS — Z419 Encounter for procedure for purposes other than remedying health state, unspecified: Secondary | ICD-10-CM | POA: Diagnosis not present

## 2024-03-05 MED ORDER — PANTOPRAZOLE SODIUM 20 MG PO TBEC: 20.0000 mg | DELAYED_RELEASE_TABLET | Freq: Every day | ORAL | 0 refills | Status: DC

## 2024-04-01 ENCOUNTER — Other Ambulatory Visit: Payer: Self-pay | Admitting: Nurse Practitioner

## 2024-04-01 DIAGNOSIS — K219 Gastro-esophageal reflux disease without esophagitis: Secondary | ICD-10-CM

## 2024-04-03 DIAGNOSIS — Z419 Encounter for procedure for purposes other than remedying health state, unspecified: Secondary | ICD-10-CM | POA: Diagnosis not present

## 2024-05-03 DIAGNOSIS — Z419 Encounter for procedure for purposes other than remedying health state, unspecified: Secondary | ICD-10-CM | POA: Diagnosis not present

## 2024-05-22 ENCOUNTER — Encounter: Payer: Medicaid Other | Admitting: Nurse Practitioner

## 2024-05-30 ENCOUNTER — Other Ambulatory Visit: Payer: Self-pay | Admitting: Nurse Practitioner

## 2024-05-30 DIAGNOSIS — I10 Essential (primary) hypertension: Secondary | ICD-10-CM

## 2024-06-03 DIAGNOSIS — Z419 Encounter for procedure for purposes other than remedying health state, unspecified: Secondary | ICD-10-CM | POA: Diagnosis not present

## 2024-06-27 ENCOUNTER — Encounter: Admitting: Nurse Practitioner

## 2024-07-04 DIAGNOSIS — Z419 Encounter for procedure for purposes other than remedying health state, unspecified: Secondary | ICD-10-CM | POA: Diagnosis not present

## 2024-07-28 ENCOUNTER — Encounter: Admitting: Nurse Practitioner

## 2024-07-29 ENCOUNTER — Encounter: Admitting: Nurse Practitioner

## 2024-08-07 ENCOUNTER — Encounter: Admitting: Nurse Practitioner

## 2024-08-20 ENCOUNTER — Encounter: Admitting: Nurse Practitioner

## 2024-08-27 ENCOUNTER — Encounter: Payer: Self-pay | Admitting: Nurse Practitioner

## 2024-10-21 ENCOUNTER — Other Ambulatory Visit: Payer: Self-pay | Admitting: Nurse Practitioner

## 2024-10-21 DIAGNOSIS — I10 Essential (primary) hypertension: Secondary | ICD-10-CM

## 2024-10-22 NOTE — Telephone Encounter (Signed)
 Requesting: AMLODIPINE  BESYLATE 10 MG TAB  Last Visit: 11/23/2023 Next Visit: Visit date not found Last Refill: 06/02/2024  Please Advise
